# Patient Record
Sex: Female | Born: 1940 | Race: White | Hispanic: No | Marital: Married | Smoking: Never smoker
Health system: Southern US, Academic
[De-identification: ages and names within clinical notes are randomized; demographics above are authoritative.]

## PROBLEM LIST (undated history)

## (undated) DIAGNOSIS — K219 Gastro-esophageal reflux disease without esophagitis: Secondary | ICD-10-CM

## (undated) DIAGNOSIS — Z8611 Personal history of tuberculosis: Secondary | ICD-10-CM

## (undated) DIAGNOSIS — I10 Essential (primary) hypertension: Secondary | ICD-10-CM

## (undated) DIAGNOSIS — E119 Type 2 diabetes mellitus without complications: Secondary | ICD-10-CM

## (undated) DIAGNOSIS — E785 Hyperlipidemia, unspecified: Secondary | ICD-10-CM

## (undated) HISTORY — PX: HAND SURGERY: SHX662

## (undated) HISTORY — DX: Essential (primary) hypertension: I10

## (undated) HISTORY — PX: COLONOSCOPY: WVUENDOPRO10

## (undated) HISTORY — PX: HX LAP CHOLECYSTECTOMY: SHX56

## (undated) HISTORY — PX: HX HYSTERECTOMY: SHX81

---

## 1993-04-05 ENCOUNTER — Other Ambulatory Visit (HOSPITAL_COMMUNITY): Payer: Self-pay

## 2012-08-28 IMAGING — CT ABDOMEN^ABD_WO_SAFECT (ADULT)
1 series · 15 of 32 positions shown, 19 images · non-contrast
Comparison: None.

Royani, Xerxes
CT Abd/Pelvis Apolo Jonatan/[place]

Vantaan Dunder, m.d.
Exam:
CT Abdomen and Pelvis without Contrast, low dose Safe CT protocol
INDICATION: Abdominal pain.
TECHNIQUE: Axial CT imaging of the abdomen and pelvis was performed with oral contrast only.  Additional reformatted sagittal and coronal projections were also obtained.  Total radiation dose length product 486 mGy cm.

[Series 902: abd wo (safect) · axial · 0.89mm/px · z∈[-835,-405]mm · 15 of 97 slices shown, 19 images]
[im 7/97  soft-tissue]
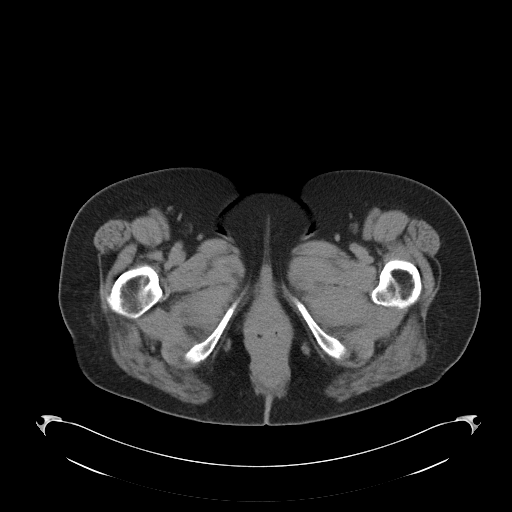
[im 7/97  bone]
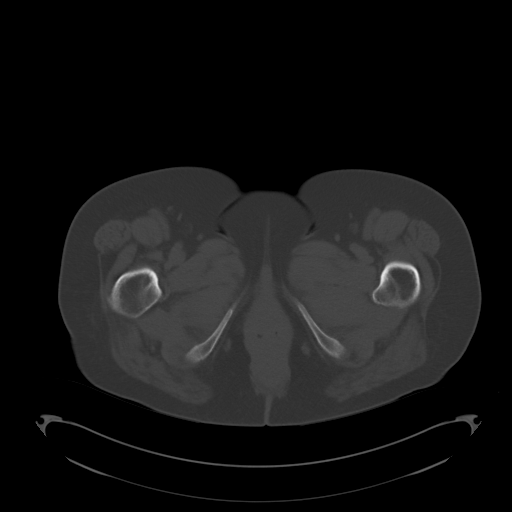
[im 13/97  soft-tissue]
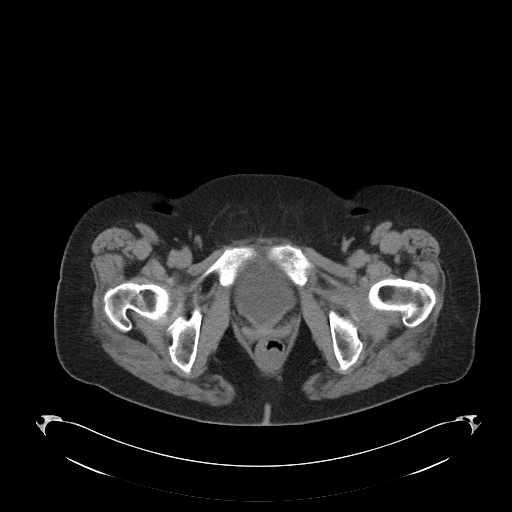
[im 19/97  soft-tissue]
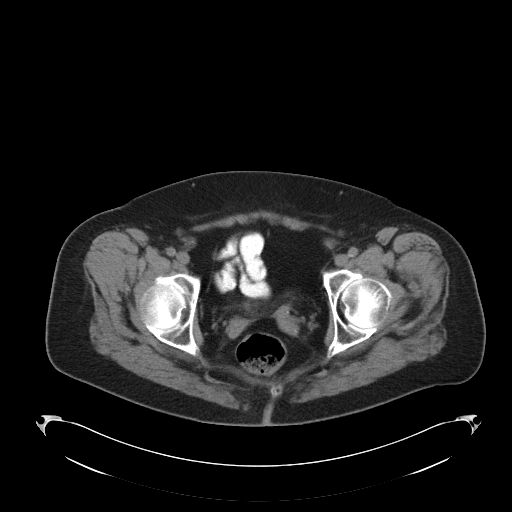
[im 28/97  soft-tissue]
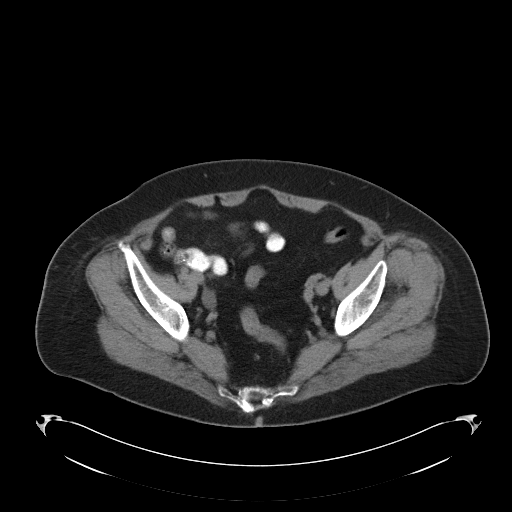
[im 35/97  soft-tissue]
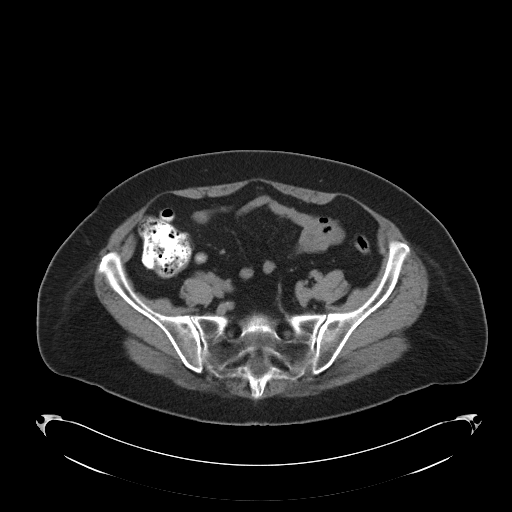
[im 41/97  soft-tissue]
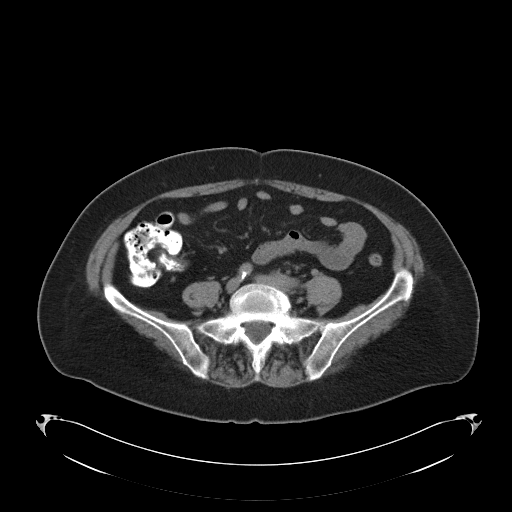
[im 50/97  soft-tissue]
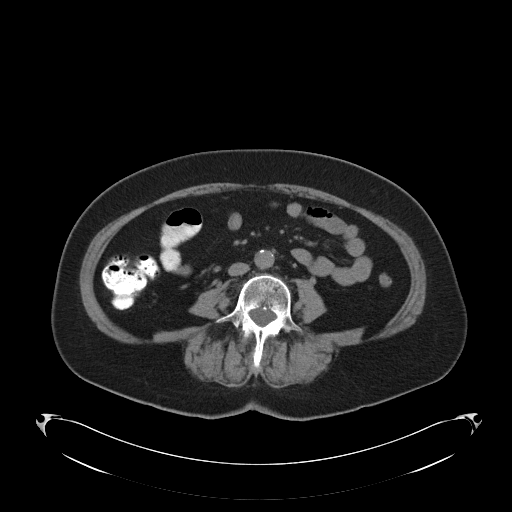
[im 56/97  soft-tissue]
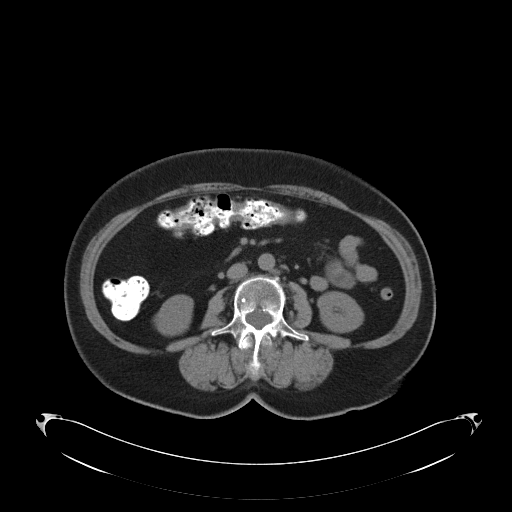
[im 62/97  soft-tissue]
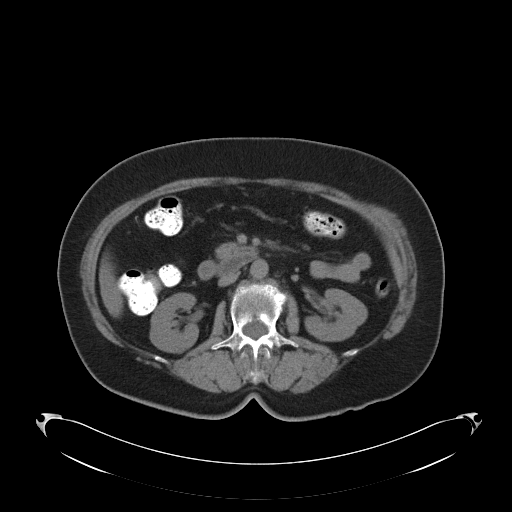
[im 62/97  bone]
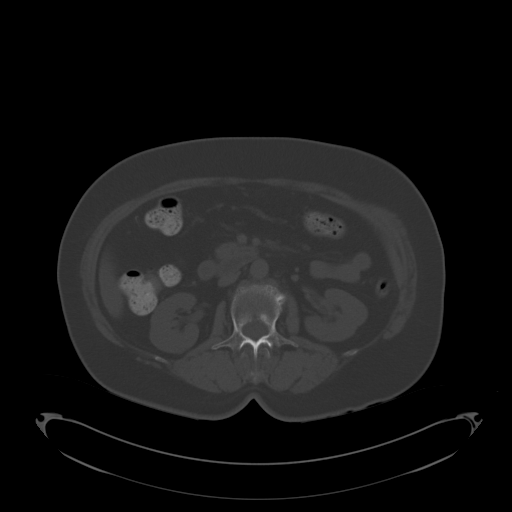
[im 69/97  soft-tissue]
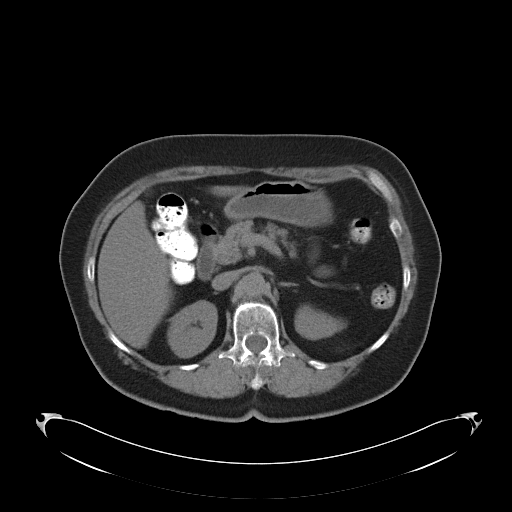
[im 78/97  soft-tissue]
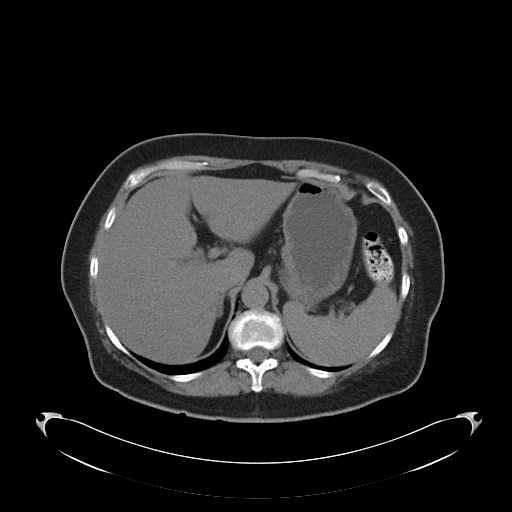
[im 84/97  soft-tissue]
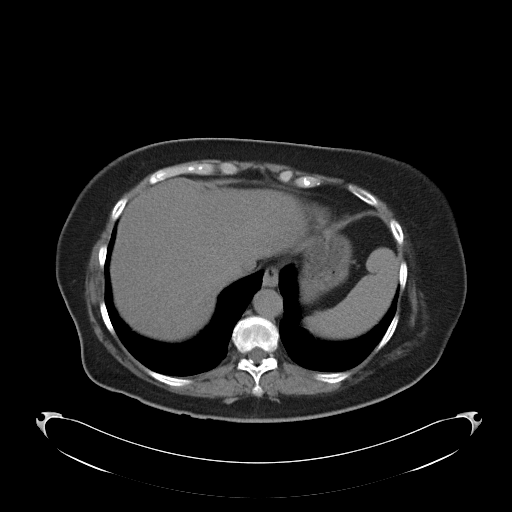
[im 84/97  lung]
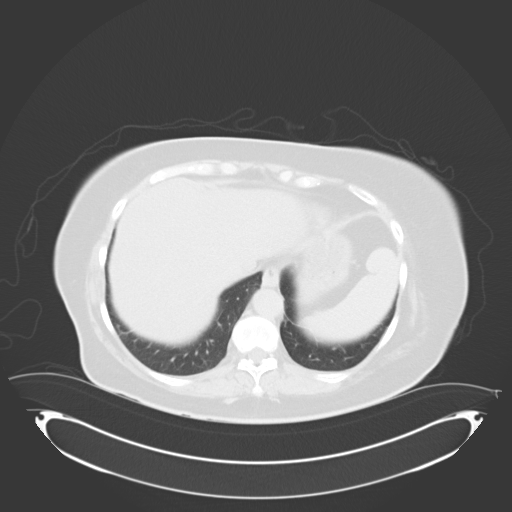
[im 87/97  lung]
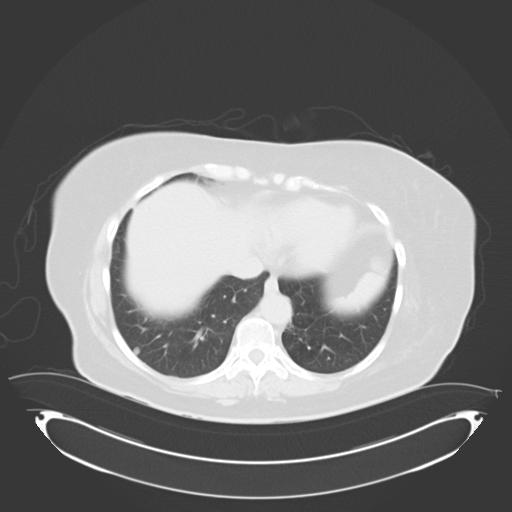
[im 90/97  soft-tissue]
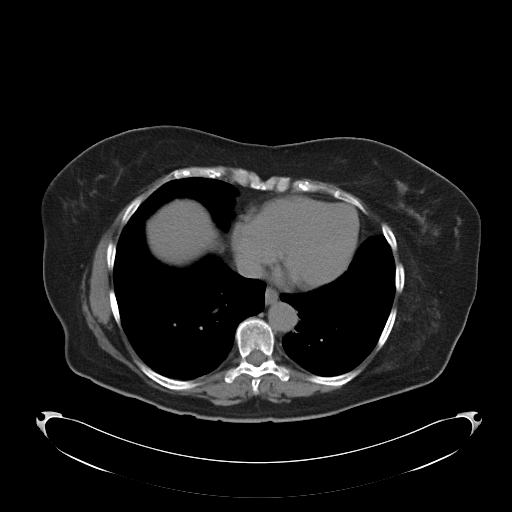
[im 90/97  lung]
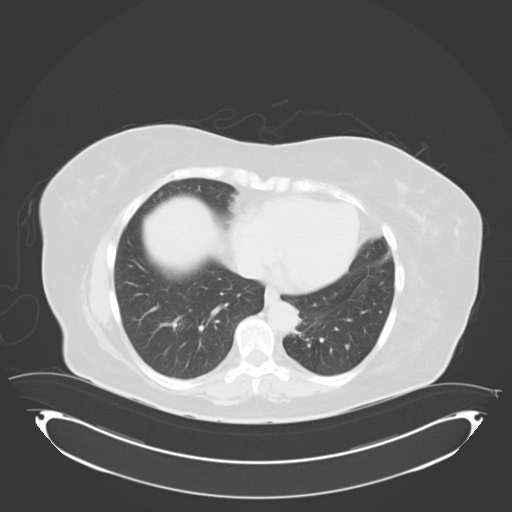
[im 93/97  lung]
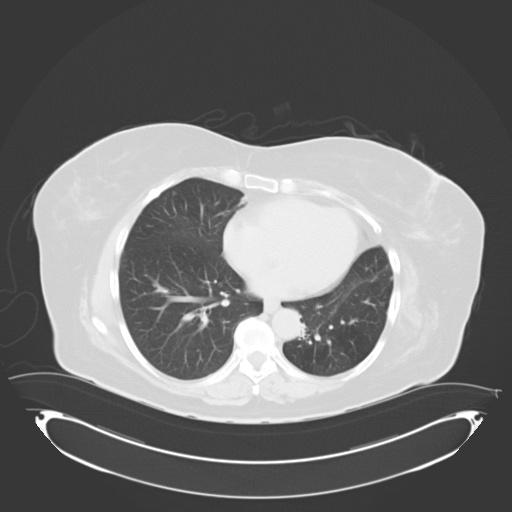

[15 of 32 positions shown; findings below may reference images not displayed]

FINDINGS: ABDOMEN:
There are multiple non-calcified nodules within the lower lobes bilaterally measuring up to 8 mm within the left lower lobe.  There is no pleural or pericardial effusion.  

There is mild fatty infiltration of the liver.  Gallbladder is surgically absent.  There is a punctate calcification within the pancreatic head suggestive of chronic pancreatitis.  Unenhanced spleen, adrenal glands and kidneys are normal.  Bowel loops are normal in course and caliber, there is no obstruction or free air.  There is no adenopathy.  There are scattered vascular calcifications.

PELVIS:
There is no acute inflammatory process.  Uterus and appendix are not visualized.  There is no pelvic free fluid.  There is no adenopathy.  Moderate to severe arthritic changes are noted within the lower lumbar spine.
IMPRESSION: A few non-calcified nodules within the lower lobes bilaterally as detailed above.  A dedicated contrast-enhanced chest CT is recommended for better assessment.
Mild fatty infiltration of the liver.
No acute abnormality within the upper abdomen and pelvis on this limited non-contrast exam.  
Prior cholecystectomy.  Appendix and uterus are also not definitively visualized.
Punctate calcification within the pancreatic head, likely due to chronic pancreatitis.  

________________________________

## 2016-09-13 IMAGING — CR XRAY HIP W/PELVIS UNIL 2-3 VIEWS
1 series · 2 of 2 positions shown · non-contrast
Comparison: None.

Exam:   

AP pelvis and left hip 2V
INDICATION: Pain.

[Series 4: view not recorded · 0.17mm/px · 2 of 2 slices shown]
[im 1/2]
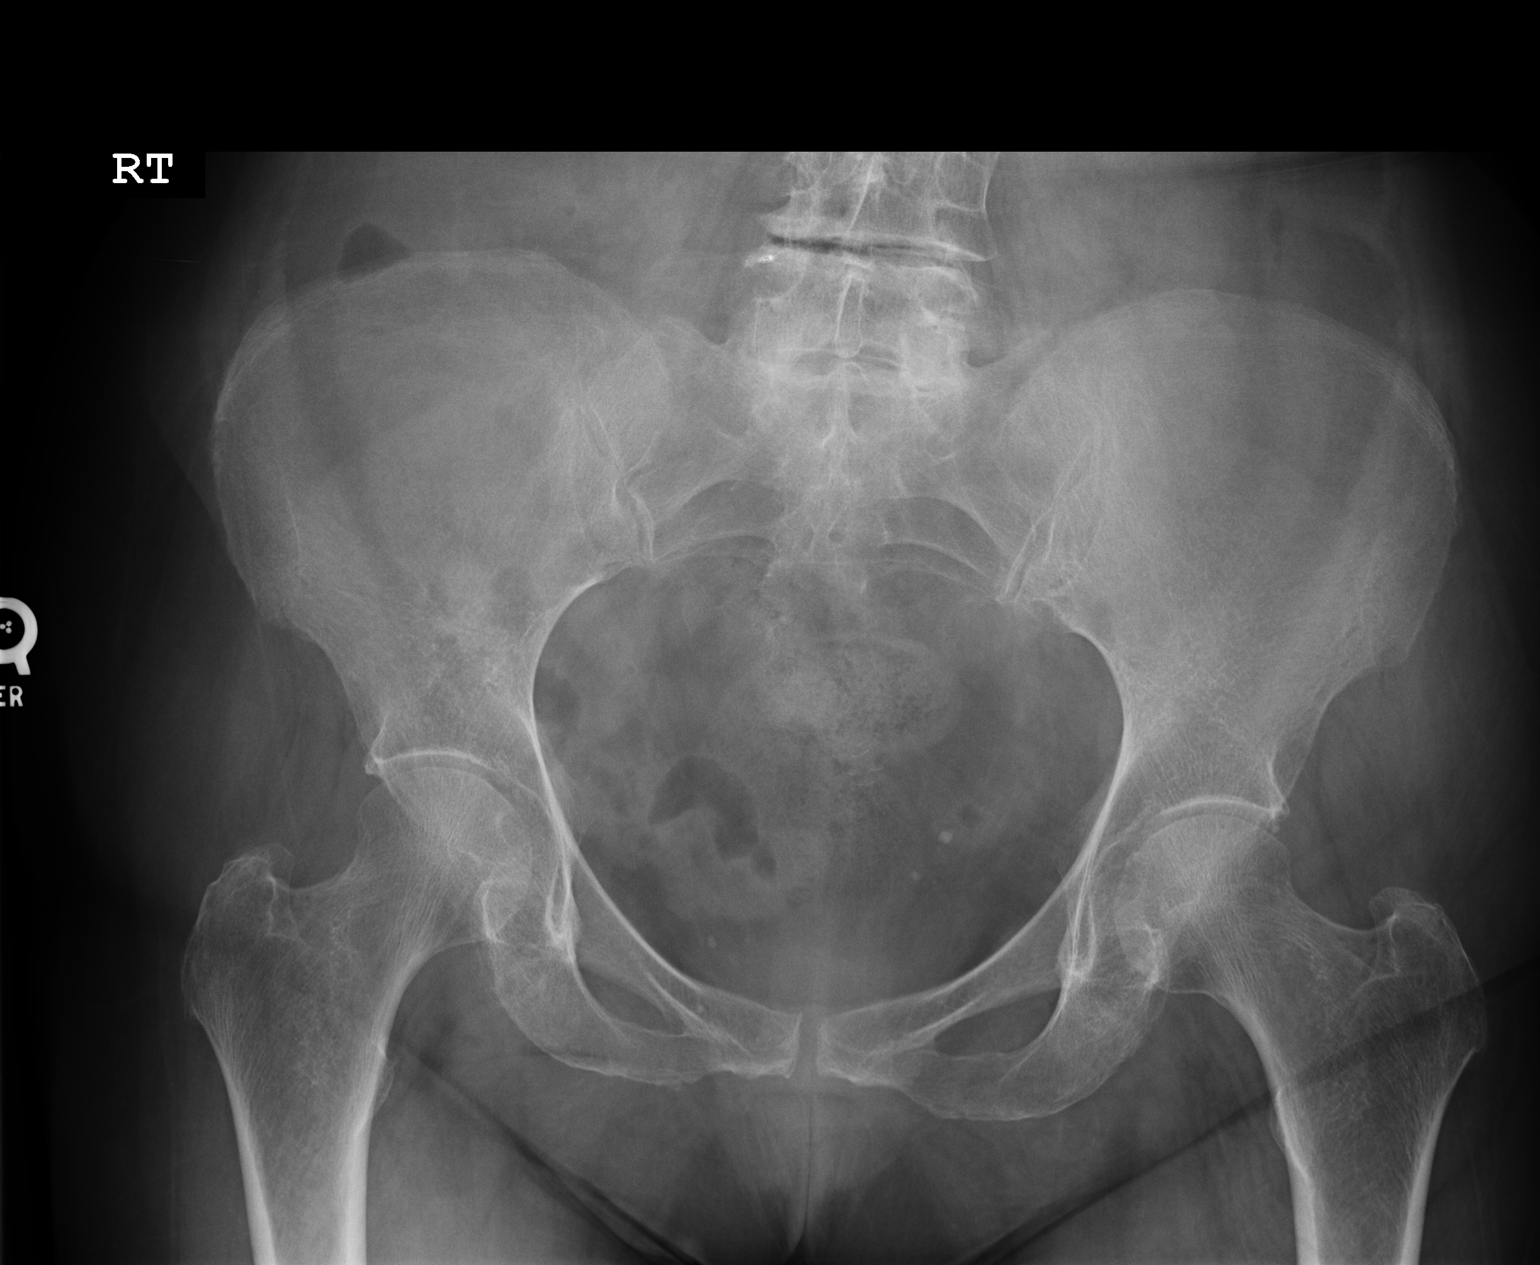
[im 2/2]
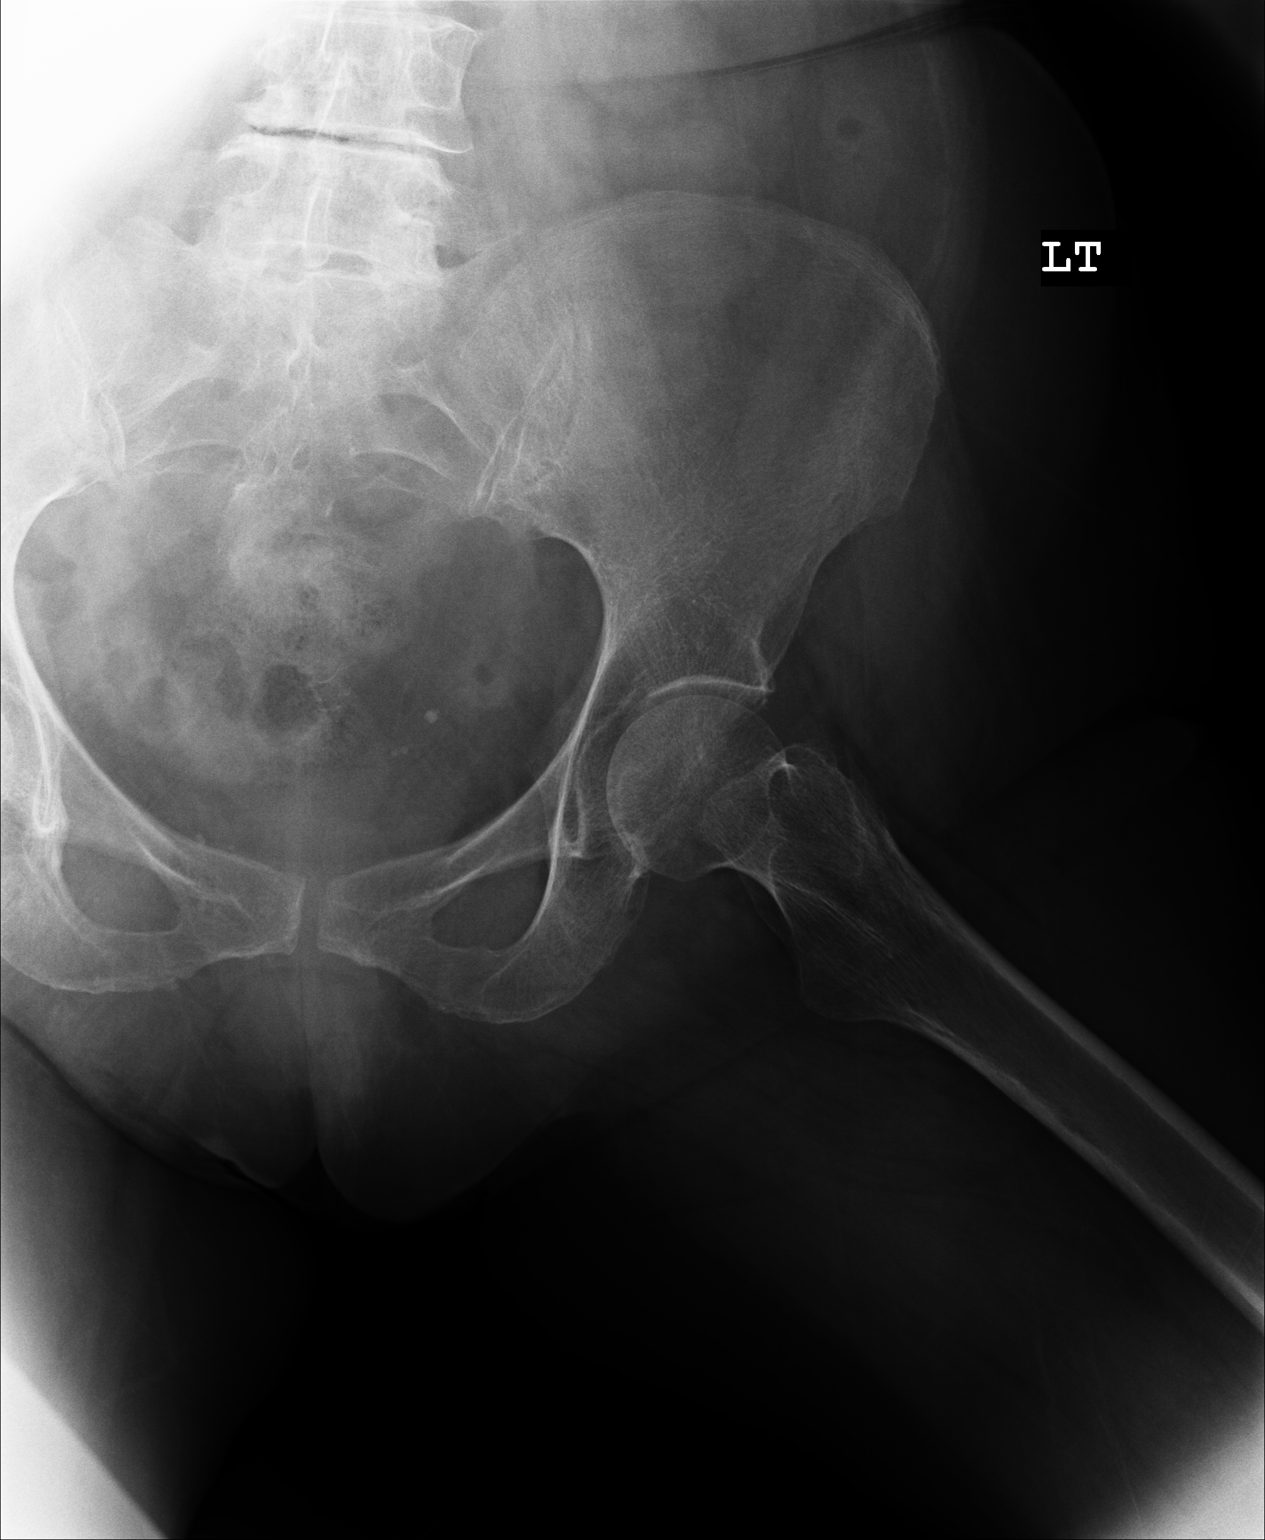

[2 of 2 positions shown; findings below may reference images not displayed]

FINDINGS: The hip is seen in two views. No fracture is seen. The joint space is preserved.
IMPRESSION: Negative left hip.

## 2019-03-18 ENCOUNTER — Ambulatory Visit (INDEPENDENT_AMBULATORY_CARE_PROVIDER_SITE_OTHER): Payer: 59 | Admitting: Rheumatology

## 2019-03-18 ENCOUNTER — Encounter (INDEPENDENT_AMBULATORY_CARE_PROVIDER_SITE_OTHER): Payer: Self-pay | Admitting: Gastroenterology

## 2019-03-18 ENCOUNTER — Other Ambulatory Visit: Payer: Self-pay

## 2019-03-18 ENCOUNTER — Ambulatory Visit: Payer: 59 | Attending: Gastroenterology | Admitting: Gastroenterology

## 2019-03-18 VITALS — BP 139/75 | HR 52 | Temp 97.6°F | Ht 62.0 in | Wt 147.5 lb

## 2019-03-18 DIAGNOSIS — R109 Unspecified abdominal pain: Secondary | ICD-10-CM

## 2019-03-18 LAB — HEPATIC FUNCTION PANEL
ALBUMIN: 4.3 g/dL (ref 3.4–4.8)
ALKALINE PHOSPHATASE: 64 U/L (ref 55–145)
ALT (SGPT): 38 U/L (ref <55)
AST (SGOT): 33 U/L (ref 8–41)
BILIRUBIN DIRECT: 0.3 mg/dL — ABNORMAL HIGH (ref <0.3)
BILIRUBIN TOTAL: 0.7 mg/dL (ref 0.3–1.3)
PROTEIN TOTAL: 7.7 g/dL (ref 6.0–8.0)

## 2019-03-18 LAB — AMYLASE: AMYLASE: 54 U/L (ref 20–160)

## 2019-03-18 LAB — LIPASE: LIPASE: 89 U/L — ABNORMAL HIGH (ref 10–80)

## 2019-03-18 NOTE — Progress Notes (Signed)
Requesting Physician: Hewitt Shorts, MD  History of Present Illness  Karen Chase is a 79 y.o. female who presents with a chief complaint of Abdominal Pain   to clinic. The patient had a small bowel obstruction which she was treated for in October. The patient reports having a history of pancreatitis 10 years ago, when she reports she had turned jaundiced at that time. 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 She may have had additional stent placements around this time as well. She reports having back and RUQ abdominal pain in October as well, and she was told it may be her pancreas. She denies any alcohol. She reports still getting intermittent back and RUQ abdominal pain that is dull. She reports intentional weight loss of 10 lb since October. She denies a family history of pancreas cancer. She also reports getting abdominal bloating often after eating. She denies symptoms.   Past History  Current Outpatient Medications   Medication Sig   . dilTIAZem (CARDIZEM CD) 240 mg Oral Capsule, Sust. Release 24 hr Take 240 mg by mouth Once a day   . ergocalciferol, vitamin D2, (DRISDOL) 1,250 mcg (50,000 unit) Oral Capsule Take 50,000 Units by mouth Every 7 days   . loratadine (CLARITIN) 10 mg Oral Tablet Take 10 mg by mouth Once a day   . LORazepam (ATIVAN) 1 mg Oral Tablet Take 1 mg by mouth Three times a day   . losartan-hydrochlorothiazide (HYZAAR) 100-25 mg Oral Tablet Take 1 Tab by mouth Once a day   . metFORMIN (GLUCOPHAGE) 500 mg Oral Tablet Take 500 mg by mouth Once a day   . metoprolol tartrate (LOPRESSOR) 25 mg Oral Tablet Take 25 mg by mouth Twice daily   . pantoprazole (PROTONIX) 40 mg Oral Tablet, Delayed Release (E.C.) Take 40 mg by mouth Once a day   . pravastatin (PRAVACHOL) 40 mg Oral Tablet Take 40 mg by mouth Once a day   . sertraline (ZOLOFT) 100 mg Oral Tablet Take 100 mg  by mouth Once a day   . traMADoL (ULTRAM) 50 mg Oral Tablet Take 1 Tab by mouth Every 6 hours as needed for Pain     No Known Allergies  Past Medical History:   Diagnosis Date   . Hypertension          Past Surgical History:   Procedure Laterality Date   . HX LAP CHOLECYSTECTOMY             Family History  Family Medical History:     None            Social History  Social History     Socioeconomic History   . Marital status: Married     Spouse name: Not on file   . Number of children: Not on file   . Years of education: Not on file   . Highest education level: Not on file   Tobacco Use   . Smoking status: Never Smoker   . Smokeless tobacco: Never Used     Review of Systems  Constitutional: negative for fevers and chills  Eyes: negative for visual disturbance and irritation  Ears, nose, mouth, throat, and face: negative for sore mouth and sore throat  Respiratory: Negative for shortness of breath and cough   Cardiovascular: negative for chest pain and palpitations  Gastrointestinal: negative for nausea  and vomiting  Genitourinary:negative for frequency and dysuria  Integument/breast: negative for rash and pruritus  Hematologic/lymphatic: negative for easy bruising and bleeding  Musculoskeletal: Negative for back or neck pain.   Neurological: negative for headaches and dizziness  Behavioral/Psych: negative  Endocrine: negative for temperature intolerance and diabetic symptoms  Allergic/Immunologic: negative for urticaria and allergies.     Examination  General: appears in good health  Eyes: Conjunctiva clear.  HENT:Head atraumatic and normocephalic  Neck: No JVD or thyromegaly  Lungs: Clear breath sounds bilaterally.   Cardiovascular: regular rate and rhythm  S1, S2 normal  Abdomen: RUQ slightly tender to palpation. Soft, non-tender, Bowel sounds normal  Extremities: Atraumatic.   Skin: Skin warm and dry   Neurologic: Grossly normal  Psychiatric: Normal    Impression  1. Abdominal pain, unspecified abdominal location       Karen Chase is a 79 y.o. female who presents with a chief complaint of Abdominal Pain to clinic.  Recommendations:  Orders Placed This Encounter   . Hepatic Function Panel   . LIPASE   . Amylase   . COVID-19 SCREENING - PREOP      Abdominal Pain    - Unlikely secondary to pancreas divisum/pancreatits    - Will plan for EGD, Colonoscopy to further evaluate abdominal pain, history of anemia, screening    - Will order liver enzymes and amylase and lipase   - Patient will bring records of her admission in October/imaging on a disc; depending on the results of the above workup, may require further imaging (CT Scan?) in the future.    - Further follow-up pending the above.     Durwin Reges, MD  03/18/2019, 13:52   Fellow - [place]    Pager #: [phone]       Patient seen and examined with the GI fellow. I was present for key portions of the H&P, and I participated in the plan of care. I agree with their findings and recommendations.     [name] J. Theodora Blow, MD FASGE   Professor of Medicine  Director of Advanced Therapeutic Endoscopy  [facility]

## 2019-04-07 ENCOUNTER — Encounter (HOSPITAL_COMMUNITY): Payer: Self-pay

## 2019-04-07 ENCOUNTER — Other Ambulatory Visit: Payer: Self-pay

## 2019-04-07 ENCOUNTER — Inpatient Hospital Stay (HOSPITAL_COMMUNITY)
Admission: RE | Admit: 2019-04-07 | Discharge: 2019-04-07 | Disposition: A | Discharge status: Home / Self Care | Payer: 59 | Source: Ambulatory Visit

## 2019-04-07 HISTORY — DX: Type 2 diabetes mellitus without complications: E11.9

## 2019-04-07 HISTORY — DX: Gastro-esophageal reflux disease without esophagitis: K21.9

## 2019-04-07 HISTORY — DX: Hyperlipidemia, unspecified: E78.5

## 2019-04-07 HISTORY — DX: Personal history of tuberculosis: Z86.11

## 2019-04-14 ENCOUNTER — Inpatient Hospital Stay
Admission: RE | Admit: 2019-04-14 | Discharge: 2019-04-14 | Disposition: A | Discharge status: Home / Self Care | Payer: 59 | Source: Ambulatory Visit | Attending: Gastroenterology | Admitting: Gastroenterology

## 2019-04-14 ENCOUNTER — Ambulatory Visit (HOSPITAL_COMMUNITY): Payer: 59 | Admitting: Certified Registered"

## 2019-04-14 ENCOUNTER — Encounter (HOSPITAL_COMMUNITY)
Admission: RE | Disposition: A | Discharge status: Home / Self Care | Payer: Self-pay | Source: Ambulatory Visit | Attending: Gastroenterology

## 2019-04-14 ENCOUNTER — Ambulatory Visit (HOSPITAL_BASED_OUTPATIENT_CLINIC_OR_DEPARTMENT_OTHER): Payer: 59 | Admitting: Certified Registered"

## 2019-04-14 ENCOUNTER — Encounter (HOSPITAL_COMMUNITY): Payer: Self-pay | Admitting: Gastroenterology

## 2019-04-14 ENCOUNTER — Other Ambulatory Visit: Payer: Self-pay

## 2019-04-14 ENCOUNTER — Ambulatory Visit (HOSPITAL_BASED_OUTPATIENT_CLINIC_OR_DEPARTMENT_OTHER): Payer: 59 | Admitting: Gastroenterology

## 2019-04-14 DIAGNOSIS — K635 Polyp of colon: Secondary | ICD-10-CM | POA: Insufficient documentation

## 2019-04-14 DIAGNOSIS — D649 Anemia, unspecified: Secondary | ICD-10-CM

## 2019-04-14 DIAGNOSIS — K3189 Other diseases of stomach and duodenum: Secondary | ICD-10-CM

## 2019-04-14 DIAGNOSIS — D120 Benign neoplasm of cecum: Secondary | ICD-10-CM

## 2019-04-14 DIAGNOSIS — Z79899 Other long term (current) drug therapy: Secondary | ICD-10-CM | POA: Insufficient documentation

## 2019-04-14 DIAGNOSIS — R1011 Right upper quadrant pain: Secondary | ICD-10-CM

## 2019-04-14 DIAGNOSIS — Z1211 Encounter for screening for malignant neoplasm of colon: Secondary | ICD-10-CM

## 2019-04-14 DIAGNOSIS — Z01818 Encounter for other preprocedural examination: Secondary | ICD-10-CM

## 2019-04-14 LAB — COVID-19 ~~LOC~~ MOLECULAR LAB TESTING
INFLUENZA VIRUS TYPE A: NOT DETECTED
INFLUENZA VIRUS TYPE B: NOT DETECTED
RESPIRATORY SYNCTIAL VIRUS (RSV): NOT DETECTED
SARS-CoV-2: NOT DETECTED

## 2019-04-14 LAB — POC BLOOD GLUCOSE (RESULTS): GLUCOSE, POC: 128 mg/dL — ABNORMAL HIGH (ref 70–105)

## 2019-04-14 SURGERY — GASTROSCOPY
Anesthesia: Monitor Anesthesia Care | Site: Mouth | Wound class: Clean Contaminated Wounds-The respiratory, GI, Genital, or urinary

## 2019-04-14 MED ORDER — LIDOCAINE (PF) 100 MG/5 ML (2 %) INTRAVENOUS SYRINGE
INJECTION | Freq: Once | INTRAVENOUS | Status: DC | PRN
Start: 2019-04-14 — End: 2019-04-14
  Administered 2019-04-14: 50 mg via INTRAVENOUS

## 2019-04-14 MED ORDER — LACTATED RINGERS INTRAVENOUS SOLUTION
INTRAVENOUS | Status: DC
Start: 2019-04-14 — End: 2019-04-14

## 2019-04-14 MED ORDER — PROPOFOL 10 MG/ML INTRAVENOUS EMULSION
INTRAVENOUS | Status: DC | PRN
Start: 2019-04-14 — End: 2019-04-14
  Administered 2019-04-14: 300 ug/kg/min via INTRAVENOUS
  Administered 2019-04-14: 12:00:00 200 ug/kg/min via INTRAVENOUS
  Administered 2019-04-14: 150 ug/kg/min via INTRAVENOUS
  Administered 2019-04-14 (×2): 0 ug/kg/min via INTRAVENOUS

## 2019-04-14 MED ORDER — EPHEDRINE SULFATE 50 MG/ML INJECTION SOLUTION
Freq: Once | INTRAMUSCULAR | Status: DC | PRN
Start: 2019-04-14 — End: 2019-04-14
  Administered 2019-04-14 (×2): 5 mg via INTRAVENOUS

## 2019-04-14 MED ORDER — SODIUM CHLORIDE 0.9 % (FLUSH) INJECTION SYRINGE
2.0000 mL | INJECTION | INTRAMUSCULAR | Status: DC | PRN
Start: 2019-04-14 — End: 2019-04-14

## 2019-04-14 MED ORDER — SODIUM CHLORIDE 0.9 % (FLUSH) INJECTION SYRINGE
2.0000 mL | INJECTION | Freq: Three times a day (TID) | INTRAMUSCULAR | Status: DC
Start: 2019-04-14 — End: 2019-04-14

## 2019-04-14 SURGICAL SUPPLY — 120 items
ATTACHMENT ENDOSCP 4MM 13.4MM RND EDGE STRL LF  DISP (GENE) IMPLANT
ATTACHMENT ENDOSCP 4MM 13.4MM_RND EDG STRL LF DISP (GENE)
ATTACHMENT ENDOSCP 4MM 15MM RN D EDG STRL LF DISP (INSTRUMENTS ENDOMECHANICAL)
ATTACHMENT ENDOSCP 4MM 15MM RND EDGE STRL LF  DISP (ENDOSCOPIC SUPPLIES) IMPLANT
BASIN EME 16OZ 9X3.8X2IN GRAD_FLXB DISP DST ROSE POLYPROP (PATU)
BASIN EME 8.4X3.8X2IN GRAD DISP DST ROSE POLYPROP C500ML LF (PATU) IMPLANT
BITE BLOCK ADULT LATEX FREE 000429 CS/50 (AIR) ×1
BLOCK BITE 20MM PE ADULT MOUTHPC STRAP RETENTION RIM LUM SCPSVR LF  LRG 27MM GRN NONST DISP (AIR) ×2 IMPLANT
BLOCK BITE 27FR INFANT BITEBLOCS PEDIABLOC LF  DISP (AIR) IMPLANT
CANNULA INJ 17GA NDLS SYRG BLUNT STRL LF  10ML BD INTRLNK PLASTIC BXTR INTLNK ABT LS MCGAW SAFELINE (IV TUBING & ACCESSORIES) IMPLANT
CANNULA NASAL 14FT ANGL FLXB L IP PLATE CRSH RS LUM TUBE NFLR (CANNULA)
CANNULA NASAL 14FT ANGL FLXB LIP PLATE CRSH RS LUM TUBE NFLR TP ADULT ARLF UCIT STD CURVE LF  DISP (CANNULA) IMPLANT
CANNULA NASAL 7FT ANGL FLXB LI P PLATE CRSH RS LUM TUBE FLR (CANNULA)
CANNULA NASAL 7FT ANGL FLXB LIP PLATE CRSH RS LUM TUBE FLR TIP ADULT ARLF UCIT STD CURVE LF  DISP (CANNULA) IMPLANT
CATH CRE 10-11-12MM 7.5FR 5.5CM 240CM 2.8MM 3.2MM LOW PROF GW BAL DIL ESOPH PYL BIL PEBAX STRL LF (BALLOON) IMPLANT
CATH ELHMST GLD PRB 10FR 300CM_BIPO RND DIST TIP STD CONN (DIAGNOSTIC)
CATH ELHMST GLD PROBE 10FR 300CM BIPOLAR RND DIST TIP STD CONN FIRM SHAFT HMGLD STRL DISP 3.7MM MN (DIAGNOSTIC) IMPLANT
CATH ELHMST GLD PROBE 7FR 300C M BIPOLAR RND DIST TIP STD (DIAGNOSTIC)
CATH ELHMST GLD PROBE 7FR 300CM BIPOLAR RND DIST TIP STD CONN FIRM SHAFT HMGLD STRL DISP 2.8MM MN (DIAGNOSTIC) IMPLANT
CATH SUCT ARLF TRIFLO 18FR 2 3ANG EYE BVL TIP CONN CONTROL PORT STRL LF  DISP CLR (Suction) IMPLANT
CLIP HMST MR CONDITIONAL BRD CATH ROT CONTROL KNOB NO SHEATH RSL 360 235CM 2.8MM 11MM OPN (SURGICAL INSTRUMENTS) IMPLANT
CONV USE ITEM 343591 - SOLIDIFY FLUID 1500ML DSPNSR L_Q TX SOLIDIFY SFTP LTS+ DISP (STER) ×2 IMPLANT
DEVICE INFNT STFR BRSTL CYTO 2.4MM 230CM STRL LF  DISP (BIOMEDICAL) IMPLANT
DEVICE SPEC RETR TLN 2.5MM 160CM 4 PRONG GRASPER INWRD HOOK SHEATH SS DISP (ENDOSCOPIC SUPPLIES) IMPLANT
DEVICE SPEC RETR TLN 2.5MM 160_CM 4 PRONG GRSP INWRD FACE (INSTRUMENTS ENDOMECHANICAL)
DILATOR ENDOS CRE 180CM 5.5CM 6-7-8MM 7.5FR ESOPH PYL BIL BAL LOW PROF GW PEBAX STRL DISP 2.8MM 3.2 (BALLOON) IMPLANT
DILATOR ENDOS CRE 180CM 5.5CM_6-7-8MM 7.5FR ESOPH PYL BIL (BALLOON)
DILATOR ENDOS CRE 180CM 8CM 10 -11-12MM 6FR ESOPH BAL LOW (BALLOON)
DILATOR ENDOS CRE 180CM 8CM 10-11-12MM 6FR ESOPH BAL LOW PROF FIX WRE PEBAX STRL LF  DISP 2.8MM (BALLOON) IMPLANT
DILATOR ENDOS CRE 180CM 8CM 15 -16.5-18MM 6FR ESOPH BAL LOW (BALLOON)
DILATOR ENDOS CRE 180CM 8CM 15-16.5-18MM 6FR ESOPH BAL LOW PROF FIX WRE PEBAX STRL LF  DISP 2.8MM (BALLOON)
DILATOR ENDOS CRE 180CM 8CM 6-7-8MM 6FR ESOPH BAL LOW PROF FIX WRE PEBAX STRL LF  DISP 2.8MM (BALLOON) IMPLANT
DILATOR ENDOS CRE 180CM 8CM 6-_7-8MM 6FR ESOPH BAL LOW PROF (BALLOON)
DILATOR ENDOS CRE 180CM 8CM 6FR 12-13.5-15MM ESOPH FIX WRE BAL RND SHLDR PEBAX STRL LF  DISP (GI LAB SUPPLIES) IMPLANT
DILATOR ENDOS CRE 180CM 8CM 8-9-10MM 6FR ESOPH BAL LOW PROF FIX WRE PEBAX STRL LF  DISP 2.8MM (GI LAB SUPPLIES) IMPLANT
DILATOR ENDOS CRE 180CM 8CM 8-_9-10MM 6FR ESOPH BAL LOW PROF (GI LAB SUPPLIES)
DILATOR ENDOS CRE 240CM 5.5CM 11-13.5-15MM 7.5FR ESOPH PYL (BALLOON)
DILATOR ENDOS CRE 240CM 5.5CM 11-13.5-15MM 7.5FR ESOPH PYL BIL BAL LOW PROF GW PEBAX STRL LF  DISP (BALLOON) IMPLANT
DILATOR ENDOS CRE 240CM 5.5CM 15-16.5-18MM 7.5FR ESOPH PYL BIL BAL LOW PROF GW PEBAX STRL LF  DISP (BALLOON) IMPLANT
DILATOR ENDOS CRE 240CM 5.5CM_15-16.5-18MM 7.5FR ESOPH PYL (BALLOON)
DILATOR HERCULES 10-11-12_M00558680 EA (BALLOON)
DISC USE ITEM 82101 - TUBING OXYGEN 50/CS 001302 (TUBE/TUBING & SUCTION SUPPLIES)
DISCONTINUED USE ITEM 309153 - TRAP SPECI ARGYLE 40CC GRAD SC_REW ON CAP REM MALE CONN (Cautery Accessories) IMPLANT
DISCONTINUED USE ITEM 339015 - CONTAINR STRL 10% NEUT BF FRMLN POLYPROP GRAD LEAK RST ORNG PREFL SCREW CAP FSHR HLTHCR PRTCL GRN (CHEM) ×10 IMPLANT
DISCONTINUED USE ITEM 82101 - TUBING OXYGEN 50/CS 001302 (TUBE/TUBING & SUCTION SUPPLIES) IMPLANT
DUPE USE ITEM 301092 - DILATOR ENDOS CRE 180CM 8CM 15-16.5-18MM 6FR ESOPH BAL LOW PROF FIX WRE PEBAX STRL LF  DISP 2.8MM (BALLOON) IMPLANT
ELECTRODE ESURG 165CM ITKNIFE NANO STRL CERM 3.5MM DISP INSL TIP LF  ACPT 2.8MM CHNL ENDOS (ENDOSCOPIC SUPPLIES) IMPLANT
ELECTRODE ESURG 165CM TRIANGLETIP KNIFE STRL CERM 4.5MM DISP LF  ACPT 2.8MM CHNL ENDOS SUBMUCOSAL (ENDOSCOPIC SUPPLIES) IMPLANT
ELECTRODE ESURG 230CM DUALKNIFE J STRL CERM 1.5MM DISP BUIL IN HNDL FLUID INJ LF  ACPT 2.8MM CHNL (Cautery Accessories) IMPLANT
ELECTRODE ESURG 230CM ITKNIFE NANO STRL CERM 3.5MM DISP INSL TIP LF  ACPT 2.8MM CHNL ENDOS (CAUTERY SUPPLIES) IMPLANT
ELECTRODE PATIENT RTN 9FT VLAB C30- LB RM PHSV ACRL FOAM CORD NONIRRITATE NONSENSITIZE ADH STRP (CAUTERY SUPPLIES) IMPLANT
ELECTRODE PATIENT RTN 9FT VLAB_REM C30- LB PLHSV ACRL FOAM (CAUTERY SUPPLIES)
FORCEPS BIOPSY 240CM 2.4MM RJ_4 2.8MM LRG CPC DISP ORNG (GUIDING)
FORCEPS BIOPSY MICROMESH TTH STREAMLINE CATH 240CM 2.4MM RJ 4 SS LRG CPC STRL DISP ORNG 2.8MM WRK (GUIDING) IMPLANT
FORCEPS BIOPSY NEEDLE 160CM 1.8MM RJ 4 DISP YW 2MM WRK CHNL GSPED (SURGICAL INSTRUMENTS) IMPLANT
FORCEPS BIOPSY NEEDLE 160CM 1._8MM RJ 4 PED 2+ MM DISP (INSTRUMENTS)
FORCEPS BIOPSY NEEDLE 240CM 2.2MM RJ 4 2.8MM STD CPC STRL DISP ORNG (SURGICAL INSTRUMENTS) IMPLANT
FORCEPS BIOPSY NEEDLE 240CM 2._2MM RJ 4 2.8MM STD CPC STRL (INSTRUMENTS)
FORCEPS ENDOS 240CM 2.8MM RJ 4 JMB DISP (SURGICAL INSTRUMENTS) ×2 IMPLANT
FORCEPS ENDOS 240CM 2.8MM RJ 4_JMB DISP (INSTRUMENTS) ×1
FORCEPS ENDOS HMST GRSPR MONOP_OL 165CM 2.75MM COAGRASPER 2.8 (INSTRUMENTS ENDOMECHANICAL)
FORCEPS ESURG 165X5MM COAGRASPER HMST STRL LF  DISP (ENDOSCOPIC SUPPLIES) IMPLANT
FORCEPS ESURG 230X4MM COAGRASPER HMST STRL LF  DISP (ENDOSCOPIC SUPPLIES) IMPLANT
FORCEPS MONOPOL 230X4MM 2.75MM 3.2MM COAGRASPER HMSTS STRL (INSTRUMENTS ENDOMECHANICAL)
FORCEPS SPEC RETR 240CM 2.3MM 15MM TFLN SS 3 RING HNDL 3 PRONG MONOF GRSP FB STRL LF  DISP (ENDOSCOPIC SUPPLIES) IMPLANT
GOWN SURG XL L3 NONREINFORCE HKLP CLSR STRL LTX PNK SMS 47IN (DGOW) IMPLANT
JELLY LUB PDI BCTRST H2O SOL N ONSTAIN NGRS STRL GLYC MTHY (SURGICAL INSTRUMENTS) IMPLANT
KIT ENDOS ENDOZIME BDSD SLR SPONGE ENZM DETERGENT 500ML (DIS) IMPLANT
KNIFE ENDOS 165CM 1.7MM .9MM I_TKNIFE NANO CERM SM INSL TIP (INSTRUMENTS ENDOMECHANICAL)
KNIFE ESURG 2300MM 2.8MM DUALK_NIFEJ BUIL IN HNDL FLUID INJ (Cautery Accessories)
LIGATOR 122MM 9.5-13MM 6SHTR SA 6 BAND TRGR CORD NONST DISP ENDOS ESOPH VARICES NATURAL RUB LTX (SUTURE STAPLING DEVICES) IMPLANT
LIGATOR 122MM 9.5-13MM 6SHTR S_D 6 BAND STRL DISP ENDOS 2.8MM (SUTURE STAPLING DEVICES)
LIGATOR 2300MM 30MM PLLP PRELD_DEV INT HNDL DISP ENDOS (INSTRUMENTS ENDOMECHANICAL)
LIGATOR DEV STRL DISP ENDOS LF (ENDOSCOPIC SUPPLIES) IMPLANT
LINER SUCT RD CRD MEDIVAC TW LOCK LID SHTOF VALVE CAN FILTER 1500CC LF  DISP (Suction) ×2 IMPLANT
LOOP AUTOCLAV HLDR DTCH 30MM S_URG NYL PLPCTM NONST DISP (INSTRUMENTS ENDOMECHANICAL)
LOOP HLDR DTCH AUTOCLAV 30MM SURG NYL PLPCTM NONST LF  DISP (ENDOSCOPIC SUPPLIES) IMPLANT
MARKER ENDOS SPOT EX PERM IND DRK SYRG 5ML (GENE) IMPLANT
NEEDLE ENDOS 5MM 25GA 2.5MM SS TFLN 230CM INJ PRJ SHEATH LL SPRG LD HNDL CRLK STRL LF  DISP (NEEDLES & SYRINGE SUPPLIES) IMPLANT
NEEDLE ENDOS 5MM 25GA 2.5MM SS TFLN 230CM INJ PRJ SHTH LL (NEEDLES & SYRINGE SUPPLIES)
NEEDLE SCLRTX 25GA 2.3MM OPTC TIP SHTH STRL LF DISP YW (NEEDLES & SYRINGE SUPPLIES) IMPLANT
NET SPEC RETR 160CM 3MM RTHNT MAXI SHEATH 8X4CM NONST LF  DISP (Dilators) IMPLANT
OVERTUBE ENDOS 25CM 19.5MM 8.6-10MM 16.7MM INSFL CAP GUARDUS STD TAPER ESPH NONST LF  DISP (AIR) IMPLANT
OVERTUBE ENDOS 25CM 19.5MM 8.6_-10MM 16.7MM INSFL CAP GUARDUS (AIR)
OVERTUBE ENDOS 50CM 19.5MM 8.6-10MM 16.7MM TAPER TIP INSFL CAP GUARDUS GASTRIC LF (ENDOSCOPIC SUPPLIES) IMPLANT
OVERTUBE ENDOS 50CM 19.5MM 8.6_-10MM 16.7MM INSFL CAP PROTECT (INSTRUMENTS ENDOMECHANICAL)
PROBE COAG 7.2FT 6.9FR FIAPC CRCMF PLUG PLAY FUNCTIONALITY FILTER (ENDOSCOPIC SUPPLIES) IMPLANT
PROBE ESURG 220CM 2.3MM FIAPC FLXB STR FIRE ARGON PLAS COAG (CAUTERY SUPPLIES)
PROBE ESURG 220CM 2.3MM FIAPC FLXB STR FIRE STRL DISP (CAUTERY SUPPLIES) IMPLANT
PROBE ESURG 7.2FT 6.9FR FIAPC_CRCMF PLUG PLAY FUNCTIONALITY (INSTRUMENTS ENDOMECHANICAL)
RETRIEVER 230CM 2.5MM ALT WV N_T ENDOS NONST RTHNT 6X3CM (INSTRUMENTS ENDOMECHANICAL)
RETRIEVER ENDOS 160CM 1.8MM RTHNT MINI SM CATH SHEATH 4.5X2CM NONST (Dilators) IMPLANT
RETRIEVER ENDOS 230CM 2.5MM RTHNT ALT WV 6X3CM (ENDOSCOPIC SUPPLIES) IMPLANT
SET EXT STR 6.2ML 42IN IV MALE LL ADPR STD BORE 2 LL ACT VALVE RETRACT COL STRL CLRLNK LF  DEHP (IV TUBING & ACCESSORIES) IMPLANT
SNARE 2.8CM RND 240CM 15MM 2.4MM CAPTIVATR II LOOP STF SHEATH BRD WRE ENDOS PLPCTM STRL DISP (ENDOSCOPIC SUPPLIES) ×2 IMPLANT
SNARE 2.8CM RND 240CM 15MM 2.4_MM CAPTIVATR II LOOP STF SHTH (INSTRUMENTS ENDOMECHANICAL) ×1
SNARE 9MM 230CM 2.4MM EXACTO COLD BRD WRE CLEAN CUT ENDOS PLC RESCT STRL LF  DISP (ENDOSCOPIC SUPPLIES) ×2 IMPLANT
SNARE MED OVAL 240CM 2.4MM SNS LOOP SHRTHRW FLXB ENDOS 2.8MM WRK CHNL PLPCTM 27MM STRL DISP (ENDOSCOPIC SUPPLIES) IMPLANT
SNARE MED OVAL 240CM 2.4MM SNS_LOOP SHRTHRW FLXB ENDOS 2.8MM (INSTRUMENTS ENDOMECHANICAL)
SNARE SM OVAL 240CM 2.4MM SNS LOOP SHRTHRW FLXB ENDOS PLPCTM 13MM STRL LF  DISP (DIAGNOSTIC) IMPLANT
SNARE SM OVL 240CM 2.4MM SNS L_OOP SHRTHRW FLXB ENDOS PLPCTM (DIAGNOSTIC)
SOLIDIFY FLUID 1500ML DSPNSR L Q TX SOLIDIFY SFTP LTS+ DISP (STER) ×1
SOLIDIFY FLUID 1500ML DSPNSR L_Q TX SOLIDIFY SFTP LTS+ DISP (STER) ×2
SOLUTION IRRG H2O 500CC 2F7113 (SOLUTIONS) ×1
SYRINGE INFLAT ALN II GA STRL DISP 60ML (NEEDLES & SYRINGE SUPPLIES) IMPLANT
SYRINGE LL 50ML LF  STRL GRAD N-PYRG DEHP-FR PVC FREE MED DISP CLR (NEEDLES & SYRINGE SUPPLIES) IMPLANT
SYRINGE LL 50ML LF STRL GRAD N-PYRG DEHP-FR PVC FREE MED (NEEDLES & SYRINGE SUPPLIES)
TRAP SPEC RETR 15CM ETRAP PLPC_TM STRL DISP (INSTRUMENTS) ×1
TRAP SPEC RETR ETRAP MAGNIFY W IND MSR GUIDE PLYP LF DISP (GI LAB SUPPLIES) IMPLANT
TRAP SPECI ARGYLE 40CC GRAD SC_REW ON CAP REM MALE CONN (Cautery Accessories)
TRAP SPECI REM 15CM ETRAP MAGNIFY WIND MSR GUIDE PLPCTM STRL LF  DISP (SURGICAL INSTRUMENTS) ×2 IMPLANT
TRAY GASTRIC LAV 36IN 24FR ARGYLE EDLICH MONOJECT MED PVC 4 EYE CLS END GRAD SYRG TRNSPR 140CC PED (TRAY) IMPLANT
TRAY GASTRIC LAV 36IN 24FR ARG_YLE EDLICH MONOJECT MED PVC 4 (TRAY)
TRAY GASTRIC LAV 36IN 34FR ARGYLE EDLICH MONOJECT LRG PVC 4 EYE CLS END GRAD SYRG TRNSPR 140CC NONST (TRAY) IMPLANT
TRAY GASTRIC LAV 36IN 34FR ARG_YLE EDLICH MONOJECT LRG PVC 4 (TRAY)
TUBING OXYGEN 50/CS 001302 (TUBE/TUBING & SUCTION SUPPLIES)
TUBING SUCT CLR 20FT 9/32IN MEDIVAC NCDTV M/M CONN STRL LF (Suction) IMPLANT
TUBING SUCT CONN 20FT LONG_STRL N720A (Suction)
USE ITEM 43607 SNARE MED OVAL 240CM 2.4MM SNS LOOP SHRTHRW FLXB ENDOS 2.8MM WRK CHNL PLPCTM 27MM STRL DISP (ENDOSCOPIC SUPPLIES) IMPLANT
WATER STRL 500ML PLASTIC PR BTL LF (SOLUTIONS) ×2 IMPLANT

## 2019-04-14 NOTE — Anesthesia Preprocedure Evaluation (Signed)
ANESTHESIA PRE-OP EVALUATION  Planned Procedure: GASTROSCOPY (N/A Mouth)  COLONOSCOPY (N/A Anus)  Review of Systems         patient summary reviewed  nursing notes reviewed        Pulmonary     Cardiovascular    ECG reviewed ,       GI/Hepatic/Renal        Endo/Other          Neuro/Psych/MS        Cancer                     Physical Assessment      Patient summary reviewed and Nursing notes reviewed   Airway       Mallampati: II      Mouth Opening: good.            Dental                    Pulmonary      (-) no stridor     Cardiovascular      Rate: Normal       Other findings            Plan  ASA 3     Planned anesthesia type: MAC              Intravenous induction     Anesthesia issues/risks discussed are: Dental Injuries, PONV and Sore Throat.  Anesthetic plan and risks discussed with patient.      Use of blood products discussed with patient who.     Patient's NPO status is appropriate for Anesthesia.           (Hard of hearing)

## 2019-04-14 NOTE — Nurses Notes (Signed)
Pt transported to area d for post op conference with dr Theodora Blow , Lorre Nick [phone] contacted and is aware.

## 2019-04-14 NOTE — Anesthesia Postprocedure Evaluation (Signed)
Anesthesia Post Op Evaluation    Patient: Karen Chase  Procedure(s) with comments:  GASTROSCOPY - covid RAPID (extra 2hr arrival) - pg Supervisor upon pt arrival  COLONOSCOPY  GASTROSCOPY WITH BIOPSY    Last Vitals:Temperature: 36.2 C (97.2 F) (04/14/19 1246)  Heart Rate: 56 (04/14/19 1246)  BP (Non-Invasive): (!) 107/52 (04/14/19 1246)  Respiratory Rate: 14 (04/14/19 1246)  SpO2: 97 % (04/14/19 [id number])    No complications documented.      Patient location during evaluation: PACU           Anesthetic complications: no  Cardiovascular status: acceptable  Respiratory status: acceptable  Hydration status: acceptable

## 2019-04-14 NOTE — Anesthesia Transfer of Care (Signed)
ANESTHESIA TRANSFER OF CARE   Karen Chase is a 79 y.o. ,female, Weight: 65.7 kg (144 lb 13.5 oz)   had Procedure(s) with comments:  GASTROSCOPY - covid RAPID (extra 2hr arrival) - pg Supervisor upon pt arrival  COLONOSCOPY  GASTROSCOPY WITH BIOPSY  performed  04/14/19   Primary Service: Shannan Harper, MD    Past Medical History:   Diagnosis Date   . Esophageal reflux     controlled with med   . History of tuberculosis     as teenager   . Hyperlipidemia    . Hypertension    . Type 2 diabetes mellitus (CMS HCC)       Allergy History as of 04/14/19      No Known Allergies              I completed my transfer of care / handoff to the receiving personnel during which we discussed:  Access, Airway, All key/critical aspects of case discussed, Analgesia, Antibiotics, Expectation of post procedure, Fluids/Product, Gave opportunity for questions and acknowledgement of understanding, Labs and PMHx    Post Location: Phase II                                                                Last OR Temp: Temperature: 36.2 C (97.2 F)  ABG:   Airway:* No LDAs found *  Blood pressure (!) 107/52, pulse 56, temperature 36.2 C (97.2 F), resp. rate 14, height 1.588 m (5' 2.5"), weight 65.7 kg (144 lb 13.5 oz), SpO2 97 %.

## 2019-04-14 NOTE — H&P (Signed)
[facility]                                                      Karen Chase, Karen Chase, 79 y.o. female  Date of Admission:  04/14/2019  Date of Birth:  1940/12/10    Date:  04/14/2019    STOP: IF H&P IS GREATER THAN 30 DAYS FROM SURGICAL DAY COMPLETE NEW H&P IS REQUIRED.    Outpatient Pre-Surgical H & P updated the day of the procedure.  1.  H&P the patient has been examined, and no change has occured in the patients condition since the H&P was completed.       Change in medications: No      Last Menstrual Period: N/A    2.  Patient continues to be appropiate candidate for planned surgical procedure. YES      H&P done on 03/17/18 by Dr. Theodora Blow.       Karen Hurt, MD  04/14/2019, 11:58  Gastroenterology Fellow PGY-6

## 2019-04-14 NOTE — Nurses Notes (Signed)
DC orders have yet to be placed by dr Theodora Blow , his service has been contacted twice.

## 2019-04-14 NOTE — Discharge Instructions (Signed)
SURGICAL DISCHARGE INSTRUCTIONS     Dr. Shannan Harper, MD  performed your GASTROSCOPY, COLONOSCOPY, GASTROSCOPY WITH BIOPSY today at the [facility]:  Monday through Friday from 6 a.m. - 7 p.m.: [phone]  Between 7 p.m. - 6 a.m., weekends and holidays:  Call [facility] at [phone] or [phone].    PLEASE SEE WRITTEN HANDOUTS AS DISCUSSED BY YOUR NURSE:      SIGNS AND SYMPTOMS OF A WOUND / INCISION INFECTION   Be sure to watch for the following:   Increase in redness or red streaks near or around the wound or incision.   Increase in pain that is intense or severe and cannot be relieved by the pain medication that your doctor has given you.   Increase in swelling that cannot be relieved by elevation of a body part, or by applying ice, if permitted.   Increase in drainage, or if yellow / green in color and smells bad. This could be on a dressing or a cast.   Increase in fever for longer than 24 hours, or an increase that is higher than 101 degrees Fahrenheit (normal body temperature is 98 degrees Fahrenheit). The incision may feel warm to the touch.    **CALL YOUR DOCTOR IF ONE OR MORE OF THESE SIGNS / SYMPTOMS SHOULD OCCUR.    ANESTHESIA INFORMATION   LOCAL ANESTHETIC:  You have receieved a local anesthetic, the effects should disappear in a few hours.    REMEMBER   If you experience any difficulty breathing, chest pain, bleeding that you feel is excessive, persistent nausea or vomiting or for any other concerns:  Call your physician Dr. Theodora Blow at [phone] or [phone]. You may also ask to have the GI doctor on call paged. They are available to you 24 hours a day.    SPECIAL INSTRUCTIONS / COMMENTS       FOLLOW-UP APPOINTMENTS   Please call patient services at [phone] or [phone] to schedule a date / time of return. They are open Monday - Friday from 7:30 am - 5:00 pm.

## 2019-04-17 LAB — SURGICAL PATHOLOGY SPECIMEN

## 2019-04-23 ENCOUNTER — Ambulatory Visit (INDEPENDENT_AMBULATORY_CARE_PROVIDER_SITE_OTHER): Payer: Self-pay | Admitting: Gastroenterology

## 2019-04-23 NOTE — Telephone Encounter (Addendum)
Returned call to office. Faxed procedure reports and pathology report per request to [phone].Roswell Miners, RN  04/23/2019, 14:02  ----- Message from Ralene Cork, [place] COORDINATOR sent at 04/23/2019  1:39 PM EST -----  ----- Message from Frederico Hamman sent at 04/23/2019  1:25 PM EST -----  Shannan Harper, MD    Dr. Doralee Albino office calling to request notes from pt's procedure on 04/14/19. Please call to advise. Thank you!

## 2019-05-13 ENCOUNTER — Encounter (HOSPITAL_COMMUNITY): Payer: Self-pay | Admitting: NURSE PRACTITIONER

## 2021-05-06 ENCOUNTER — Other Ambulatory Visit: Payer: Self-pay

## 2021-05-06 ENCOUNTER — Emergency Department
Admission: EM | Admit: 2021-05-06 | Discharge: 2021-05-06 | Disposition: A | Discharge status: Home / Self Care | Payer: 59 | Attending: Emergency Medicine | Admitting: Emergency Medicine

## 2021-05-06 ENCOUNTER — Encounter (HOSPITAL_COMMUNITY): Payer: Self-pay

## 2021-05-06 DIAGNOSIS — Z794 Long term (current) use of insulin: Secondary | ICD-10-CM | POA: Insufficient documentation

## 2021-05-06 DIAGNOSIS — M542 Cervicalgia: Secondary | ICD-10-CM

## 2021-05-06 DIAGNOSIS — R531 Weakness: Secondary | ICD-10-CM

## 2021-05-06 DIAGNOSIS — S161XXA Strain of muscle, fascia and tendon at neck level, initial encounter: Secondary | ICD-10-CM | POA: Insufficient documentation

## 2021-05-06 DIAGNOSIS — E119 Type 2 diabetes mellitus without complications: Secondary | ICD-10-CM | POA: Insufficient documentation

## 2021-05-06 DIAGNOSIS — X58XXXA Exposure to other specified factors, initial encounter: Secondary | ICD-10-CM | POA: Insufficient documentation

## 2021-05-06 DIAGNOSIS — R5383 Other fatigue: Secondary | ICD-10-CM | POA: Insufficient documentation

## 2021-05-06 LAB — COMPREHENSIVE METABOLIC PANEL, NON-FASTING
ALBUMIN/GLOBULIN RATIO: 1.2 (ref 0.8–1.4)
ALBUMIN: 4.1 g/dL (ref 3.5–5.7)
ALKALINE PHOSPHATASE: 56 U/L (ref 34–104)
ALT (SGPT): 24 U/L (ref 7–52)
ANION GAP: 8 mmol/L — ABNORMAL LOW (ref 10–20)
AST (SGOT): 25 U/L (ref 13–39)
BILIRUBIN TOTAL: 0.6 mg/dL (ref 0.3–1.2)
BUN/CREA RATIO: 16 (ref 6–22)
BUN: 19 mg/dL (ref 7–25)
CALCIUM, CORRECTED: 9.6 mg/dL (ref 8.9–10.8)
CALCIUM: 9.7 mg/dL (ref 8.6–10.3)
CHLORIDE: 99 mmol/L (ref 98–107)
CO2 TOTAL: 27 mmol/L (ref 21–31)
CREATININE: 1.21 mg/dL (ref 0.60–1.30)
ESTIMATED GFR: 45 mL/min/{1.73_m2} — ABNORMAL LOW (ref >59)
GLOBULIN: 3.4 (ref 2.9–5.4)
GLUCOSE: 112 mg/dL — ABNORMAL HIGH (ref 74–109)
OSMOLALITY, CALCULATED: 271 mOsm/kg (ref 270–290)
POTASSIUM: 3.8 mmol/L (ref 3.5–5.1)
PROTEIN TOTAL: 7.5 g/dL (ref 6.4–8.9)
SODIUM: 134 mmol/L — ABNORMAL LOW (ref 136–145)

## 2021-05-06 LAB — URINALYSIS, MACRO/MICRO
BILIRUBIN: NEGATIVE mg/dL
BLOOD: NEGATIVE mg/dL
GLUCOSE: NEGATIVE mg/dL
KETONES: NEGATIVE mg/dL
LEUKOCYTES: NEGATIVE WBCs/uL
NITRITE: NEGATIVE
PH: 5.5 (ref 5.0–9.0)
PROTEIN: NEGATIVE mg/dL
SPECIFIC GRAVITY: 1.005 (ref 1.002–1.030)
UROBILINOGEN: NORMAL mg/dL

## 2021-05-06 LAB — CBC WITH DIFF
BASOPHIL #: 0.1 10*3/uL (ref 0.00–2.50)
BASOPHIL %: 1 % (ref 0–3)
EOSINOPHIL #: 0.3 10*3/uL (ref 0.00–2.40)
EOSINOPHIL %: 3 % (ref 0–7)
HCT: 35.6 % — ABNORMAL LOW (ref 37.0–47.0)
HGB: 12.8 g/dL (ref 12.5–16.0)
LYMPHOCYTE #: 1.6 10*3/uL — ABNORMAL LOW (ref 2.10–11.00)
LYMPHOCYTE %: 21 % — ABNORMAL LOW (ref 25–45)
MCH: 30.8 pg (ref 27.0–32.0)
MCHC: 36 g/dL (ref 32.0–36.0)
MCV: 85.7 fL (ref 78.0–99.0)
MONOCYTE #: 0.6 10*3/uL (ref 0.00–4.10)
MONOCYTE %: 7 % (ref 0–12)
MPV: 6.9 fL — ABNORMAL LOW (ref 7.4–10.4)
NEUTROPHIL #: 5.2 10*3/uL (ref 4.10–29.00)
NEUTROPHIL %: 68 % (ref 40–76)
PLATELETS: 187 10*3/uL (ref 140–440)
RBC: 4.16 10*6/uL — ABNORMAL LOW (ref 4.20–5.40)
RDW: 13.3 % (ref 11.6–14.8)
WBC: 7.7 10*3/uL (ref 4.0–10.5)
WBCS UNCORRECTED: 7.7 10*3/uL

## 2021-05-06 LAB — MAGNESIUM: MAGNESIUM: 1.5 mg/dL — ABNORMAL LOW (ref 1.9–2.7)

## 2021-05-06 LAB — TROPONIN-I: TROPONIN I: 4 ng/L (ref <15)

## 2021-05-06 LAB — LAVENDER TOP TUBE

## 2021-05-06 LAB — ECG 12 LEAD: Calculated T Axis: 29 degrees

## 2021-05-06 LAB — THYROID STIMULATING HORMONE (SENSITIVE TSH): TSH: 2.123 u[IU]/mL (ref 0.450–5.330)

## 2021-05-06 LAB — GRAY TOP TUBE

## 2021-05-06 LAB — BLUE TOP TUBE

## 2021-05-06 MED ORDER — MAGNESIUM SULFATE 1 GRAM/100 ML IN DEXTROSE 5 % INTRAVENOUS PIGGYBACK
1.0000 g | INJECTION | INTRAVENOUS | Status: AC
Start: 2021-05-06 — End: 2021-05-06
  Administered 2021-05-06: 0 g via INTRAVENOUS
  Administered 2021-05-06: 1 g via INTRAVENOUS
  Administered 2021-05-06: 0 g via INTRAVENOUS
  Administered 2021-05-06: 1 g via INTRAVENOUS

## 2021-05-06 MED ORDER — MAGNESIUM SULFATE 1 GRAM/100 ML IN DEXTROSE 5 % INTRAVENOUS PIGGYBACK
INJECTION | INTRAVENOUS | Status: AC
Start: 2021-05-06 — End: 2021-05-06
  Filled 2021-05-06: qty 100

## 2021-05-06 MED ORDER — SODIUM CHLORIDE 0.9 % (FLUSH) INJECTION SYRINGE
3.0000 mL | INJECTION | Freq: Three times a day (TID) | INTRAMUSCULAR | Status: DC
Start: 2021-05-06 — End: 2021-05-06

## 2021-05-06 MED ORDER — SODIUM CHLORIDE 0.9 % (FLUSH) INJECTION SYRINGE
3.0000 mL | INJECTION | INTRAMUSCULAR | Status: DC | PRN
Start: 2021-05-06 — End: 2021-05-06

## 2021-05-06 NOTE — Discharge Instructions (Addendum)
See your doctor in 1 week. You will need to have your magnesium level rechecked before Wednesday  Continue your normal medications at home as prescribed by your doctor   Return to ER if there is any emergencies  Slow-Mag twice a day on empty stomach  Your primary care provider needs to order repeat blood work including a BMP with magnesium level in 2 weeks.

## 2021-05-06 NOTE — ED Provider Notes (Addendum)
[facility]  ED Primary Provider Note  Patient Name: Karen Chase  Patient Age: 81 y.o.  Date of Birth: [birth year]    Chief Complaint: Irregular Heartbeat        History of Present Illness       Karen Chase is a 81 y.o. female who had concerns including Irregular Heartbeat.      HPI    Chief complaint:  Weakness  Patient states she has significant generalized weakness, fatigue malaise.  She was sent here by her PCP because of low heart rate in the 40s from Dr. Ruthell Rummage office.  She states that it was not Dr. Ferdinand Lango it was 1 of the PAs.  The patient denies any chest pain or palpitations.  Patient denies any syncope or presyncopal episodes.  She states she does have a sinus infection.  She has went through 2 rounds of antibiotics and she is finally feeling somewhat better.  She denies any dysuria or frequency but she states that she has significant nocturia.  The patient denies any abdominal pain.    Timing: 1 week  Onset:  Gradual   Duration:  Constant and getting worse   Mechanism:  Patient states she does not know why she is feeling bad   Context:  She is currently with her daughter.  She normally lives with her husband  Additional information: She also complains of neck pain in the midline of the lower neck that extends into both arms in the medial aspect of the humerus to the elbow.  Upon significant detailed questioning, it appears the patient spends a lot of time looking at a tablet and/or reading.  This tends to have an anterior head carriage and may be the culprit.    Patient denies any rash or other signs that would lead to zoster.  Patient denies any facial droop, gait failure or slurred speech    Review of Systems    ROS: No other overt Review of Systems are noted to be positive except noted in the HPI.    Social History: Reviewed and listed below    Past Medical History:Reviewed and listed in the chart below.      Past Surgical History: Reviewed and listed on the  chart below.      Family History: Reviewed and listed below    Allergies: Reviewed and listed on the chart    Medications: Reviewed and listed on the chart.    The nursing notes were reviewed and agreed upon unless otherwise stated    The vital signs were reviewed and are listed on the chart        Historical Data   History Reviewed This Encounter: Medical History  Surgical History  Family History  Social History        Physical Exam   ED Triage Vitals [05/06/21 1637]   BP (Non-Invasive) (!) 173/75   Heart Rate 60   Respiratory Rate 18   Temp    SpO2 98 %   Weight 67.6 kg (149 lb)   Height 1.575 m (_0 )         Physical Exam   Constitutional/general:     81 year old well-appearing female who is in no acute distress.  She appears to be uncomfortable but no severe pain.  The patient appears to be much younger than her stated age.  HEENT: [Eyes show normal extraocular movements.  Well-hydrated oral mucosa is noted.  There is no facial trauma or abnormalities.]  Patient has severe hearing loss.  Neck: [No midline tenderness, no meningeal signs.  However, the patient is complaining of midline pain in the lower cervical spine.  Looking at her in the back, she has a slumped posture with an anterior head carriage.  Full range of motion.  No JVD.]  Cardiovascular: Doug Sou is regular rate and rhythm S1-S2 sounds were auscultated without murmur click or rub]  Respiratory: [Lungs are clear to auscultation in all fields without wheeze rale or rhonchi.]  GI: Sharlee Blew is soft non tender normal bowel sounds are auscultated.  There is no rebound tenderness or guarding]  Neuro [cranial nerves II through XII are intact and normal.  Patient has normal speech and normal gait.  There is no muscle weakness in any extremities.  Sensation is intact throughout.  ]  Psych: Alto [place] is alert and oriented person place and time.  Patient is very pleasant converse with has a euthymic affect.  There are no signs of depression or  anxiety.]  Skin: [No rash.  No petechiae or purpura.  The skin is warm and dry without diaphoresis.  There is no pallor.]  Musculoskeletal: [place] Scrape is no tenderness to palpation in any body region.  There is no lower extremity edema and no calf tenderness.  Negative Homans sign bilaterally.]      Procedures none      Patient Data     Labs Ordered/Reviewed   COMPREHENSIVE METABOLIC PANEL, NON-FASTING - Abnormal; Notable for the following components:       Result Value    SODIUM 134 (*)     ANION GAP 8 (*)     ESTIMATED GFR 45 (*)     GLUCOSE 112 (*)     All other components within normal limits    Narrative:     Estimated Glomerular Filtration Rate (eGFR) is calculated using the CKD-EPI (2021) equation, intended for patients 45 years of age and older. If gender is not documented or "unknown", there will be no eGFR calculation.   MAGNESIUM - Abnormal; Notable for the following components:    MAGNESIUM 1.5 (*)     All other components within normal limits   CBC WITH DIFF - Abnormal; Notable for the following components:    RBC 4.16 (*)     HCT 35.6 (*)     MPV 6.9 (*)     LYMPHOCYTE % 21 (*)     LYMPHOCYTE # 1.60 (*)     All other components within normal limits   TROPONIN-I - Normal   THYROID STIMULATING HORMONE (SENSITIVE TSH) - Normal   URINALYSIS, MACRO/MICRO - Normal   CBC/DIFF    Narrative:     The following orders were created for panel order CBC/DIFF.  Procedure                               Abnormality         Status                     ---------                               -----------         ------                     CBC WITH VQQV[[id number]]  Abnormal            Final result                 Please view results for these tests on the individual orders.   BLUE TOP TUBE   GRAY TOP TUBE   URINALYSIS WITH REFLEX MICROSCOPIC AND CULTURE IF POSITIVE    Narrative:     The following orders were created for panel order URINALYSIS WITH REFLEX MICROSCOPIC AND CULTURE IF POSITIVE.  Procedure                                Abnormality         Status                     ---------                               -----------         ------                     URINALYSIS, MACRO/MICRO[[id number]]      Normal              Final result                 Please view results for these tests on the individual orders.     EKG is interpreted by me at 4:48 p.m..  This will be reviewed by cardiologist later time.    Left axis deviation.  Poor R-wave progression across the anterior precordial leads.  Normal PR and QT/QTC intervals.  There are no ST elevations no ST depressions.  Minimal J-point elevation in V1 V2.  No Q-waves noted at this time.  Sinus bradycardia 55 beats per minute noted    No orders to display       Medical Decision Making         MDM    ED Course as of 05/16/21 2226   Fri May 06, 2021   1942 Patient was given 2 g of magnesium, because it.   patient disposition significantly delayed by lack of running the UA.         Medications Administered in the ED   magnesium sulfate 1 G in D5W 100 mL premix IVPB (0 g Intravenous Stopped 05/06/21 2112)       Response to treatment: Patient is feeling much better now.  She states she feels better now than she has in months.  I have discussed the plan with the patient and her daughter.  They will repeat labs including magnesium about 2 weeks.  I will start her on Slow-Mag twice a day during that time.  He will be up to her PCP to order these outpatient labs at that time.                 Discharge Medication List as of 05/06/2021  9:25 PM              ICD-10-CM    1. Hypomagnesemia Active E83.42       2. Other fatigue Active R53.83       3. Cervical strain, acute Active S16.1XXA       4. Type 2 diabetes mellitus without complication, without long-term current use of insulin (CMS HCC) Active E11.9  Discussed with the patient and all questions fully answered. She will call me if any problems arise.    Patient discharged home with family.  AVS reviewed with patient/care giver.  A  written copy of the AVS and discharge instructions was given to the patient/care giver.  Questions sufficiently answered as needed.  Patient/care giver encouraged to follow up with PCP as indicated.  In the event of an emergency, patient/care giver instructed to call 911 or go to the nearest emergency room.

## 2021-05-06 NOTE — ED Triage Notes (Signed)
Bilateral arm pain with left greater than right x2 weeks that started with neck pain and radiated into shoulder and arms.

## 2021-05-07 LAB — ECG 12 LEAD
Atrial Rate: 55 {beats}/min
Atrial Rate: 54 {beats}/min
Calculated P Axis: 79 degrees
Calculated P Axis: 64 degrees
Calculated R Axis: 11 degrees
Calculated R Axis: 16 degrees
Calculated T Axis: 32 degrees
PR Interval: 196 ms
PR Interval: 206 ms
QRS Duration: 76 ms
QRS Duration: 80 ms
QT Interval: 456 ms
QT Interval: 452 ms
QTC Calculation: 436 ms
QTC Calculation: 428 ms
Ventricular rate: 55 {beats}/min
Ventricular rate: 54 {beats}/min

## 2021-05-23 DIAGNOSIS — G5702 Lesion of sciatic nerve, left lower limb: Secondary | ICD-10-CM | POA: Insufficient documentation

## 2021-05-23 DIAGNOSIS — M25552 Pain in left hip: Secondary | ICD-10-CM | POA: Insufficient documentation

## 2021-05-23 DIAGNOSIS — M7551 Bursitis of right shoulder: Secondary | ICD-10-CM | POA: Insufficient documentation

## 2021-05-23 DIAGNOSIS — M25512 Pain in left shoulder: Secondary | ICD-10-CM | POA: Insufficient documentation

## 2021-06-01 DIAGNOSIS — R55 Syncope and collapse: Secondary | ICD-10-CM | POA: Insufficient documentation

## 2021-07-07 DIAGNOSIS — M7542 Impingement syndrome of left shoulder: Secondary | ICD-10-CM | POA: Insufficient documentation

## 2021-07-07 DIAGNOSIS — M19011 Primary osteoarthritis, right shoulder: Secondary | ICD-10-CM | POA: Insufficient documentation

## 2021-07-22 IMAGING — MR MRI SHOULDER LT W/O CONTRAST
6 series · 38 of 40 positions shown · IV contrast (gadolinium)
Comparison: None available.

﻿EXAM:  [id number]   MRI SHOULDER LT W/O CONTRAST
INDICATION: Pain and decreased range of motion.
TECHNIQUE: Multiplanar multisequential MRI of the left shoulder joint was performed without gadolinium contrast.

[Series 6: T1 · oblique · left · 3.5mm · 0.33mm/px · 7 of 18 slices shown]
[im 1/18]
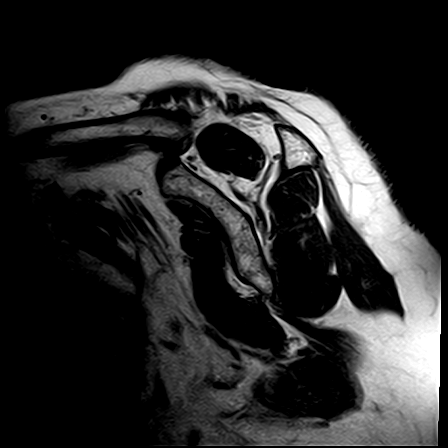
[im 3/18]
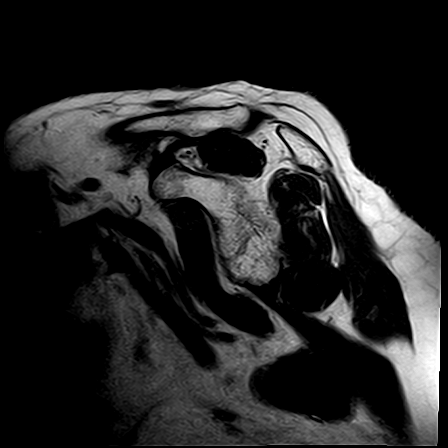
[im 6/18]
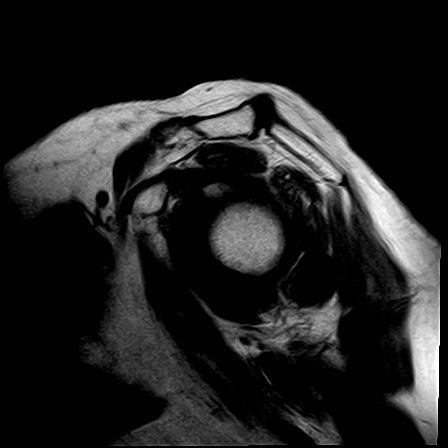
[im 9/18]
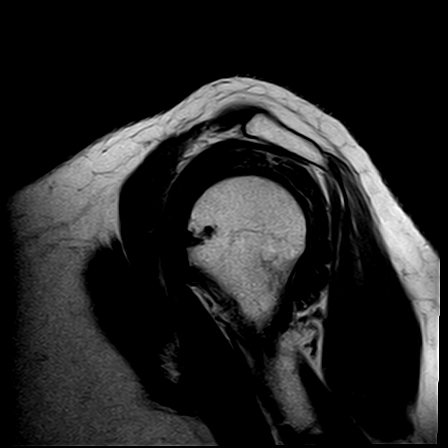
[im 12/18]
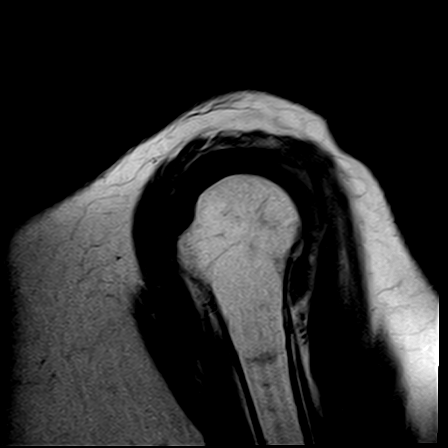
[im 15/18]
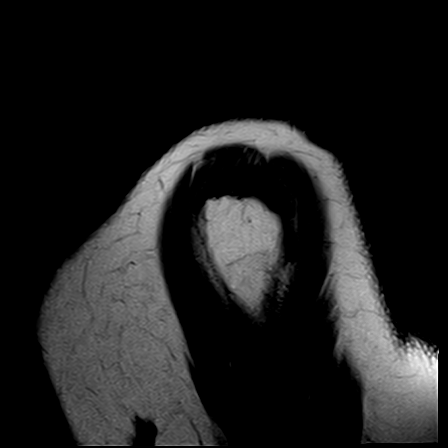
[im 18/18]
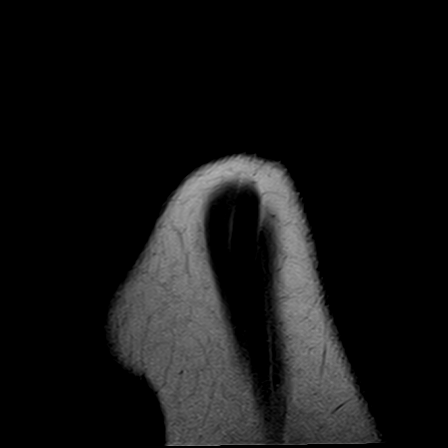

[Series 7: STIR · oblique · left · 3.5mm · 0.47mm/px · 7 of 18 slices shown (1 of 2)]
[im 1/18]
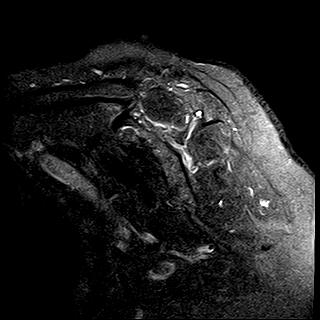
[im 3/18]
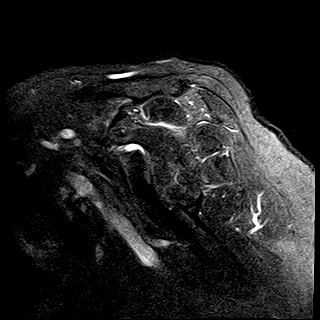
[im 6/18]
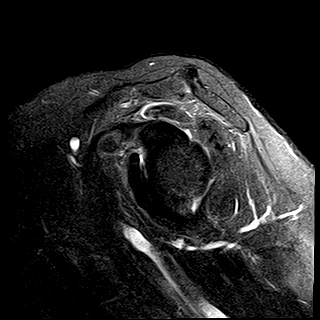
[im 9/18]
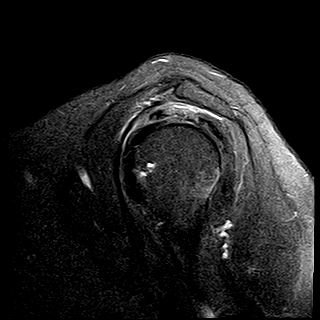
[im 12/18]
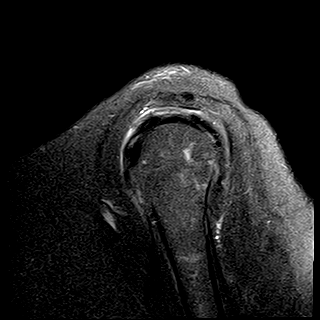
[im 15/18]
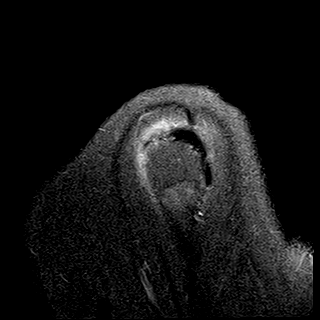
[im 18/18]
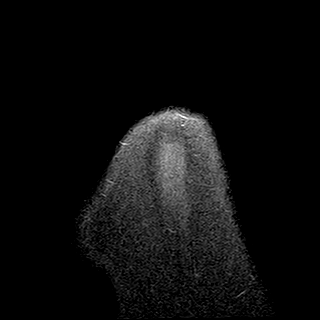

[Series 8: PD fat-sat · axial · left · 4.0mm · 0.50mm/px · z∈[-45,+32]mm · 6 of 18 slices shown (1 of 2)]
[im 1/18]
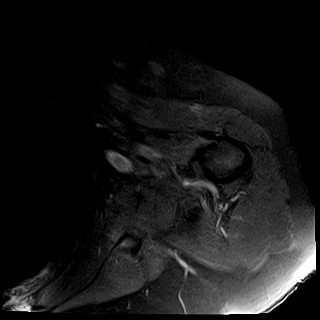
[im 4/18]
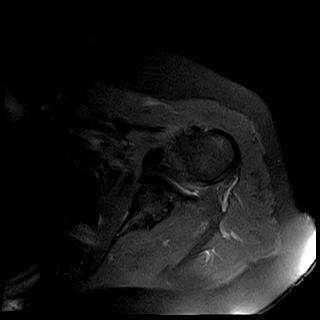
[im 7/18]
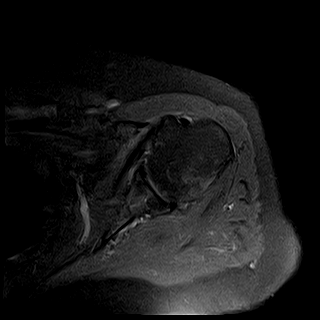
[im 11/18]
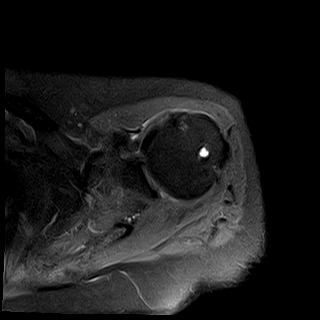
[im 14/18]
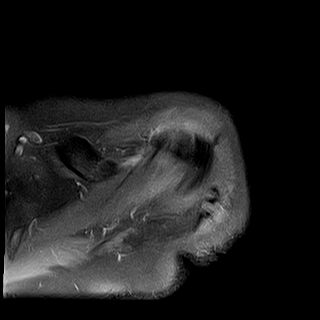
[im 18/18]
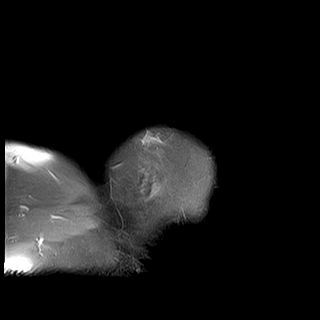

[Series 9: T2 fat-sat · axial · left · 4.0mm · 0.42mm/px · z∈[-58,+45]mm · 8 of 24 slices shown]
[im 1/24]
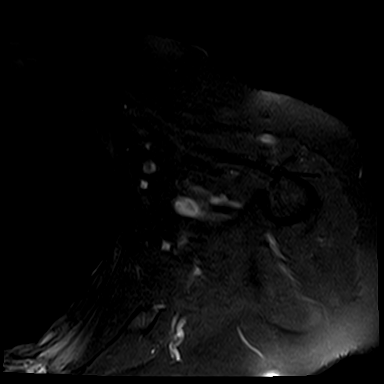
[im 4/24]
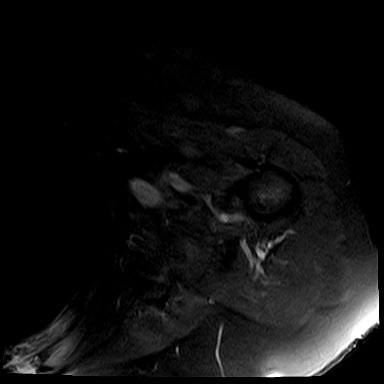
[im 7/24]
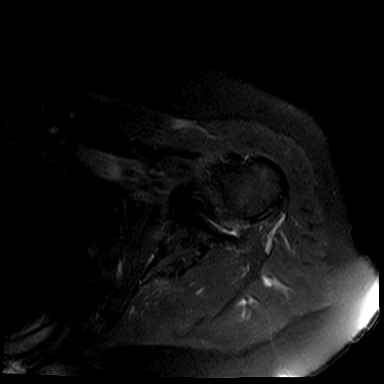
[im 10/24]
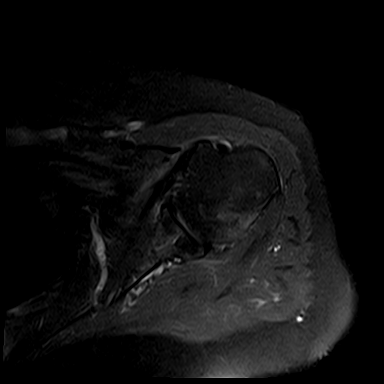
[im 14/24]
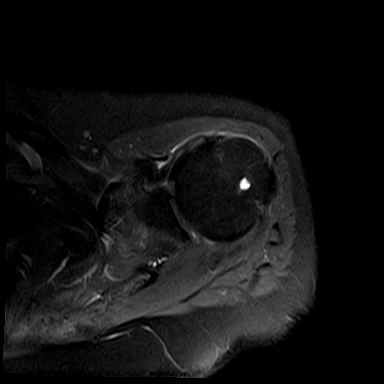
[im 17/24]
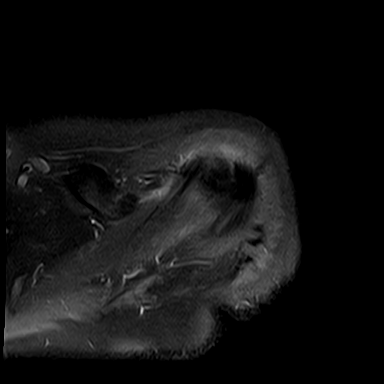
[im 20/24]
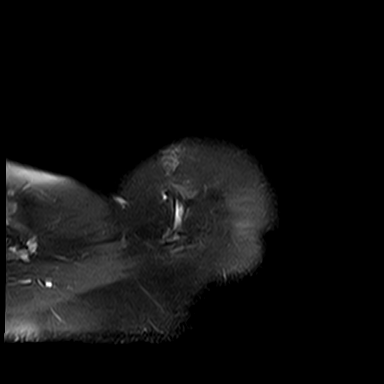
[im 24/24]
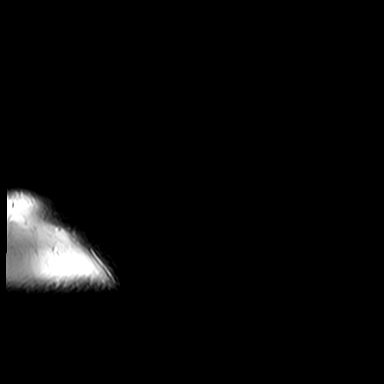

[Series 10: PD fat-sat · oblique · left · 3.5mm · 0.47mm/px · 6 of 18 slices shown (2 of 2)]
[im 1/18]
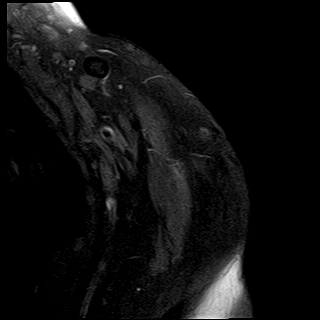
[im 4/18]
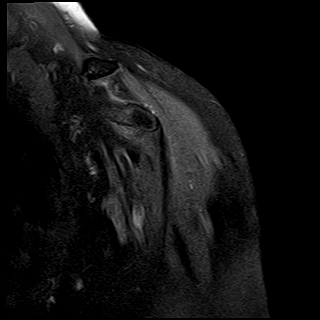
[im 7/18]
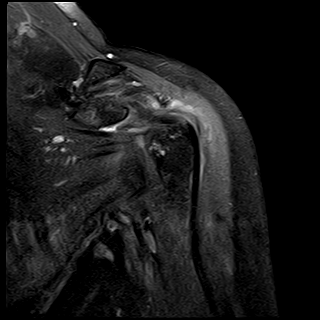
[im 11/18]
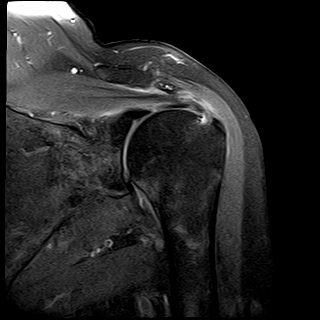
[im 14/18]
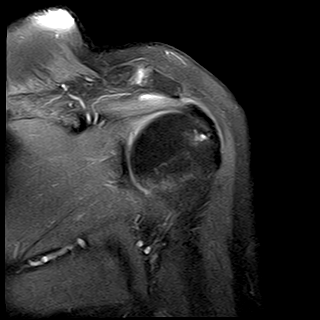
[im 18/18]
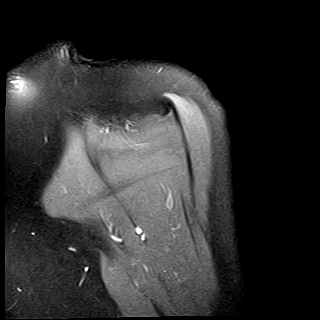

[Series 11: STIR · oblique · left · 3.5mm · 0.47mm/px · 4 of 18 slices shown (2 of 2)]
[im 1/18]
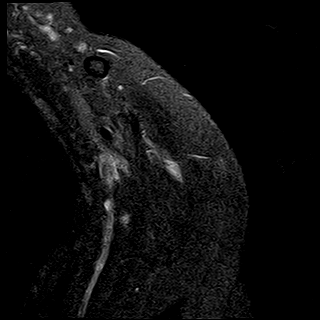
[im 4/18]
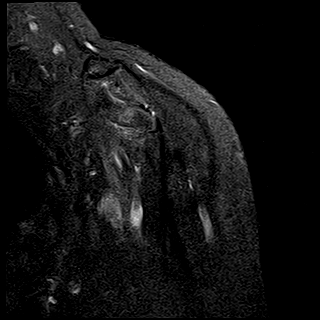
[im 7/18]
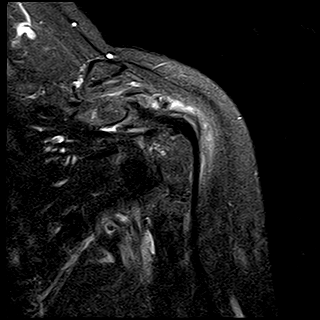
[im 11/18]
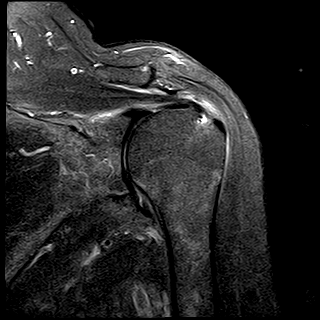

[38 of 40 positions shown; findings below may reference images not displayed]

FINDINGS: There is mild supraspinatus tendinopathy. Infraspinatus, teres minor and subscapularis muscles and tendons are within normal limits in morphology and signal intensity.  Long head of biceps tendon is well seated within the intertubercular groove and attaches normally to the biceps anchor.  Superior labrum is intact. Glenohumeral articular cartilage is well maintained.  There is no significant fluid within the subacromial/subdeltoid bursa.  There is moderate acromioclavicular joint osteoarthritis. Muscle bulk and bone marrow signal intensity are normal.  No mass is seen along the course of the suprascapular nerve, within the spinoglenoid notch or within the quadrilateral space.
IMPRESSION: 1. Mild supraspinatus tendinopathy.  

2. Moderate acromioclavicular joint osteoarthritis.

## 2021-07-28 DIAGNOSIS — S46819A Strain of other muscles, fascia and tendons at shoulder and upper arm level, unspecified arm, initial encounter: Secondary | ICD-10-CM | POA: Insufficient documentation

## 2021-07-28 DIAGNOSIS — M2550 Pain in unspecified joint: Secondary | ICD-10-CM | POA: Insufficient documentation

## 2021-08-17 ENCOUNTER — Other Ambulatory Visit (HOSPITAL_COMMUNITY): Payer: Self-pay | Admitting: Cardiovascular Disease

## 2021-08-17 DIAGNOSIS — R001 Bradycardia, unspecified: Secondary | ICD-10-CM

## 2021-08-17 DIAGNOSIS — R55 Syncope and collapse: Secondary | ICD-10-CM

## 2021-08-24 ENCOUNTER — Other Ambulatory Visit: Payer: Self-pay

## 2021-08-24 ENCOUNTER — Inpatient Hospital Stay
Admission: RE | Admit: 2021-08-24 | Discharge: 2021-08-24 | Disposition: A | Discharge status: Home / Self Care | Payer: 59 | Source: Ambulatory Visit | Attending: Cardiovascular Disease | Admitting: Cardiovascular Disease

## 2021-08-24 DIAGNOSIS — R001 Bradycardia, unspecified: Secondary | ICD-10-CM | POA: Insufficient documentation

## 2021-08-24 DIAGNOSIS — R55 Syncope and collapse: Secondary | ICD-10-CM | POA: Insufficient documentation

## 2021-09-09 LAB — 3 DAY EXTENDED HOLTER MONITOR
Heart rate (average): 73 {beats}/min
Isolated SVE count: 376 episodes
Longest supraventricular tachycardia episode - duration: 6.4 s
Longest supraventricular tachycardia episode - heart rate (: 136 {beats}/min
Longest supraventricular tachycardia episode - number of be: 14 beats
SVE Couplets Counts: 30 episodes
SVE Triplets Counts: 7 episodes
Supraventricular tachycardia - heart rate (average): 126 {beats}/min
Supraventricular tachycardia - number of episodes: 8
Supraventricular tachycardia with fastest heart rate - dura: 4 s
Supraventricular tachycardia with fastest heart rate - hear: 140 {beats}/min
Supraventricular tachycardia with fastest heart rate - numb: 9 beats

## 2022-03-09 IMAGING — DX XRAY CHEST 2 VIEWS
1 series · 3 of 3 positions shown · non-contrast
Comparison: None available.

﻿EXAM:  [id number]   XRAY CHEST 2 VIEWS
INDICATION: Cough and congestion.  History of left thoracotomy. No history of cancer.
TECHNIQUE: Two views of the chest.

[Series 1: PA · 0.14mm/px · 3 of 3 slices shown]
[im 1/3]
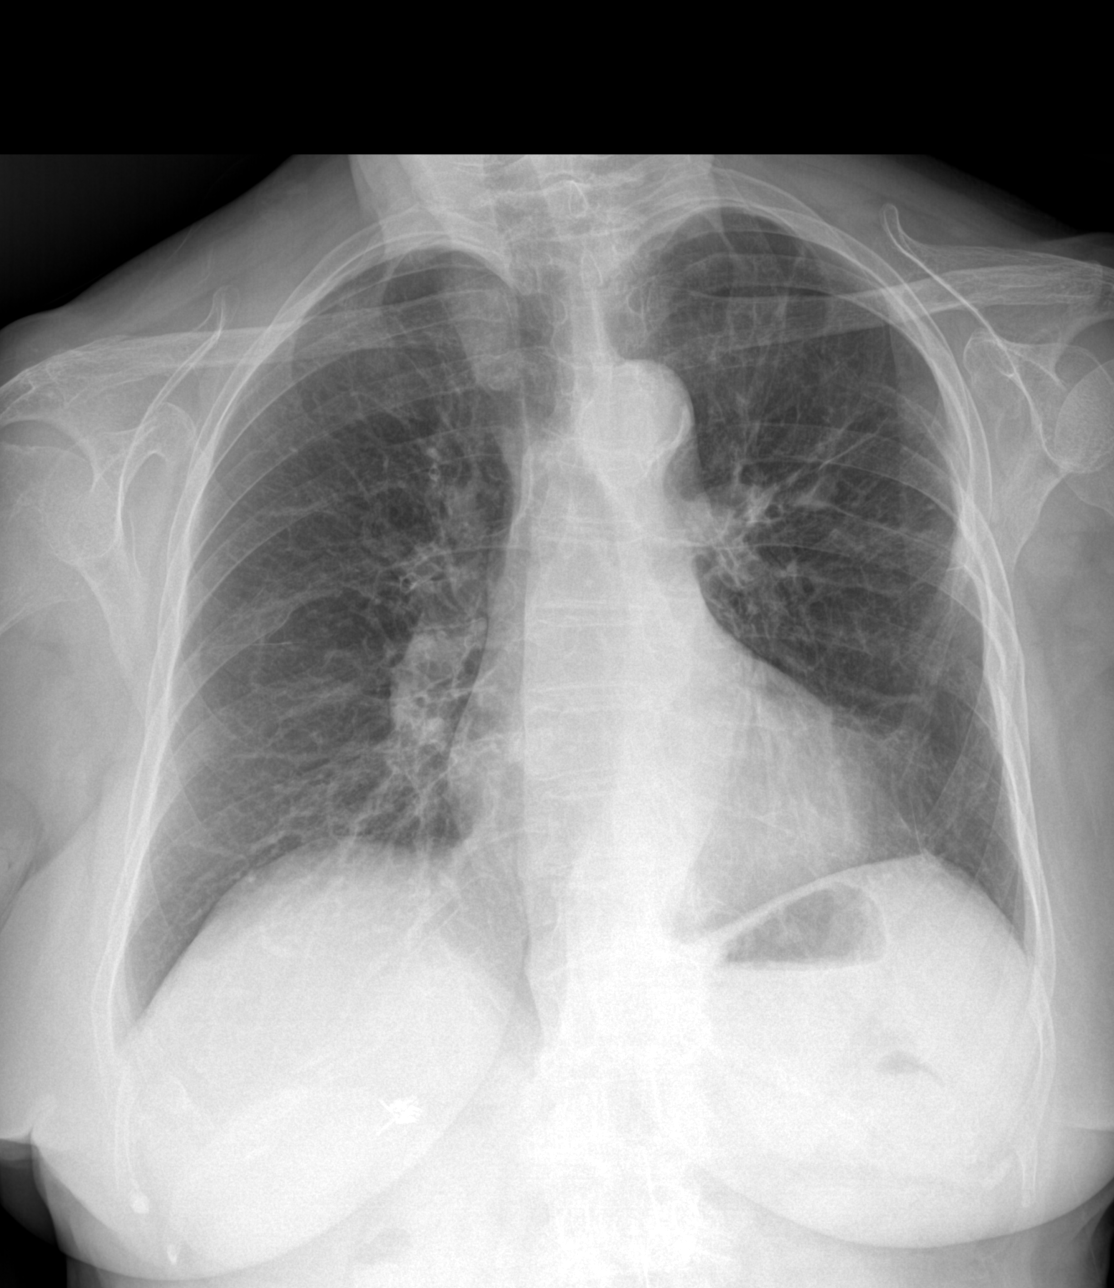
[im 2/3]
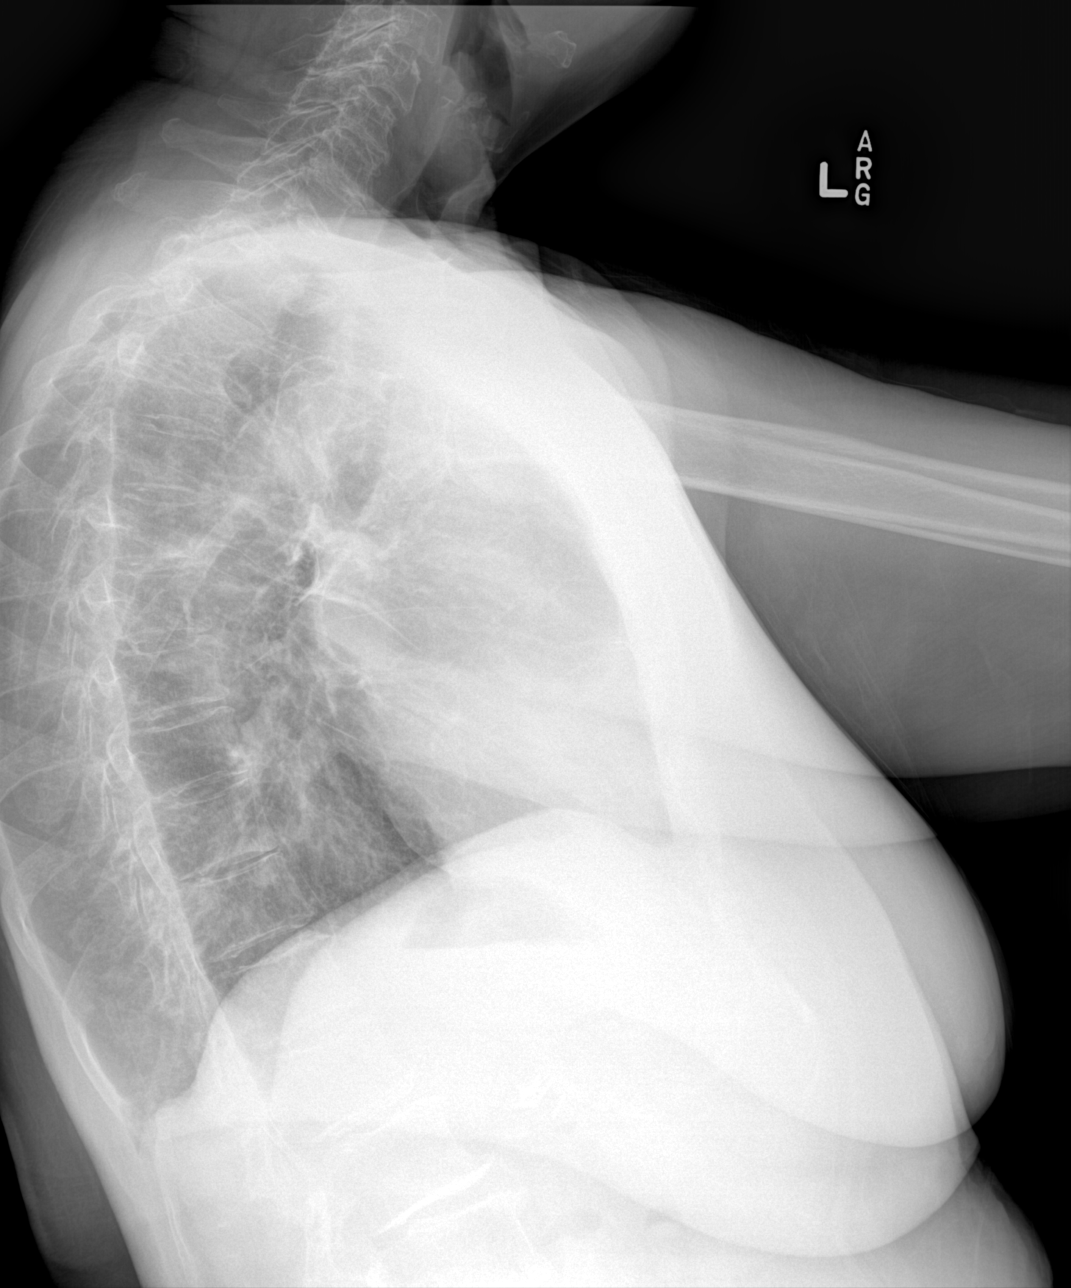
[im 3/3]
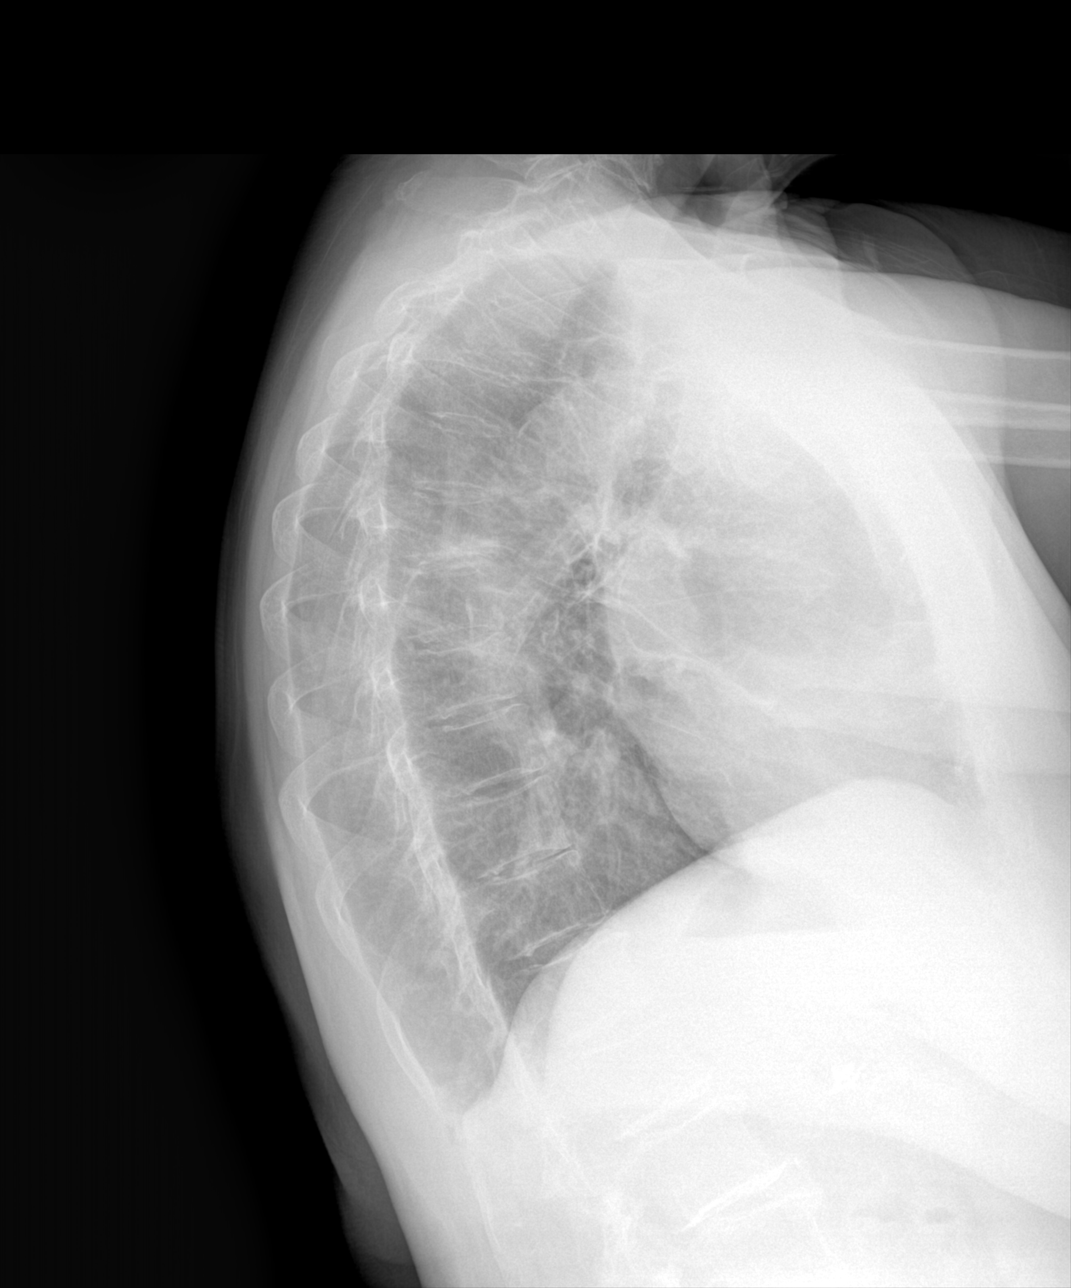

[3 of 3 positions shown; findings below may reference images not displayed]

FINDINGS: Moderate cardiomegaly and significant atherosclerotic aorta.  Lungs are free of acute process. Postoperative changes of left hemithorax are noted.  Hila and mediastinum are normal.
IMPRESSION: Postsurgical changes of left chest. Cardiomegaly, atherosclerotic aorta.  No acute pulmonary findings in the two views of the chest.

## 2022-03-23 DIAGNOSIS — R911 Solitary pulmonary nodule: Secondary | ICD-10-CM | POA: Insufficient documentation

## 2022-04-06 NOTE — Discharge Summary (Signed)
 -------------------------------------------------------------------------------  Attestation signed by Raj Cindie HERO, MD at 04/06/2022  3:23 PM  For this patient encounter, I was the Clinical Oversight Physician. This includes discussion of the plan of care for this patient and independent examination.     Cindie HERO Raj, MD    -------------------------------------------------------------------------------      Physician/Midlevel Provider Discharge Summary  [facility]   Patient Name: Karen Chase   Patient Age: 82 y.o.   Patient DOB: December 27, 1940   MRN: [id number]   PCP: Harl Barnie LABOR   Admit date: 03/23/2022   Discharge date:  03/25/2022   Admitting Physician: Cindie HERO Raj, MD    Admission Diagnoses: Lung nodule [R91.1]   Discharge Physician: Odella LITTIE Hales, PA   Discharge Diagnoses:  Principal Problem:    Lung nodule  Active Problems:    COVID      Consults:  none   Procedures: none     Admission History     Karen Chase is a 82 y.o. female who presents to the ED for complaint of SOB, dizziness, numbness. Pt w/ past medical hx significant for hypertension. Pt to ED c/o SOB, dizziness, and numbness in bilateral lower extremities. She reports she had gone to see her cardiologist Dr. Christin and was advised to come to the ED due to weakness and nausea. She reports that for the past 2x yrs she has had intermittent episodes of being sick. She reports that her current episode has been ongoing since Thanksgiving w/ weakness, dizziness, nausea, and cough. Pt had been seen at this ED on 1/17 c/o similar symptoms. Labs and chest xray obtained at that visit returned negative, and pt was given an albuterol  inhaler and started on doxycycline and prednisone. Since then she reports her cough has improved but she continues to feel dizzy and weak. She states the dizziness is intermittent, and reports that it seems to occur randomly. She reports that she does feel generally weak  w/ increased fatigue. On arrival to ED she reports that the dizziness and weakness have improved some from this AM. Additionally, pt also states that she had a syncopal episode 2 weeks ago after standing up, without any injury. She denies fever, chills, diarrhea, abdominal pain, chest pain, hematuria, dysuria. Overall poor historian, not able to give specifics very well or describe exactly how she is feeling now or that brought her in, but does not seem confused or altered.  See H&P for further details  Hospital Course     Subjective   -  Alert and pleasant - NAD.  VSS.  Labvs eseentailly WNL.  Work up other then initial CT with metastaic lung nodules esentially unremarkable. further CTs/ radiology were WITHOUT evidence of cancerous or metastatic dx.  Pt understanding of need for further outpt discussion and work up via PCP.   Refuses SNF.  Will order [facility] and PT/OT.  Discussed with pts daughter as well.  Home today - return to ED as needed      Pt denies any other neurological, cardiopulmonary, GI, or urinary symptoms.    Objective  Blood pressure (!) 166/79, pulse 73, temperature 97.7 F (36.5 C), resp. rate 26, height 1.575 m (5' 2), weight 54.2 kg (119 lb 8 oz), SpO2 93%.    Constitutional: Oriented to person, place, and time. Appears well-developed and well-nourished. No distress.   Head: Normocephalic and atraumatic.   Neck: Neck supple. Trachea midline.  No JVD  Cardiovascular: RRR  - No murmur heard.  Pulmonary/Chest: Effort normal and breath sounds normal. No respiratory distress. CTA.  On RA  Abdominal: SNTND - normal BS. No masses. There is no rebound and no guarding.   Musculoskeletal: General: No edema. FROM  Neurological: Alert and oriented to person, place, and time. CN intact  Skin: Skin is warm and dry. Not diaphoretic.   Psychiatric: Normal mood and affect. Behavior is normal.     Assessment/Plan During Hospital Course  Lung nodule  Bradycardia:acuteillness or injury  Dysphagia, unspecified  type:acuteillness or injury  Lung nodules:acuteillness or injury  Shortness of breath:acuteillness or injury  Syncope and collapse:acuteillness or injury  Weakness:acuteillness or injury      Patient presented complaining of multiple vague complaints and feeling sick for many months. She has shortness of breath, occasional dizziness and syncope, bilateral lower extremity numbness. Dizziness nausea and difficulty swallowing and funny sensation in her throat. She has had intermittent cough. Hypertensive on arrival however blood pressures decreased, she was slightly bradycardic multiple times on the monitor with heart rate in the 40s to 50s. She has some mild bilateral lower extremity edema but otherwise exam benign. Her D-dimer was elevated at 1.65, a CTA chest was obtained which showed no PE but did show multiple bilateral pulmonary nodules up to 8 mm concerning for metastases. CT head showed no acute abnormality. Urinalysis, troponins, BNP, CBC, BMP all unremarkable other than elevated creatinine of 1.18, receiving some fluids. Orthostatic vital signs negative. Admitting to the hospital for further workup for bradycardia, dizziness and syncope, and new pulmonary nodules including CT abdomen pelvis tomorrow with contrast, CT neck and soft tissue, echocardiogram, TSH, B12, folate, CEA, 19-9, physical therapy and speech/swallow evaluation. NS overnight for AKI    Chronic conditions: Resume home medications for chronic conditions. Holding metformin  due to contrast, holding metoprolol  due to bradycardia    1/26 -pt asumptomatc - still with back pain - further wok up pending - continue monitor and educate.  HR in the 60-70s.  BP elevated.  Will follow.  Discussed dispo options pending results.   1/27 - up and moving - feels improved today.  HR slightly improved still 60-70s.  Asymptomatic.  Educated on medication change.  Adding cozaar  for BP control without rate lowering issue.  No obvious signs  of cancer or metastatic dz.  Explained and discussed but will need to follow up PCP for further testing.  ECHO as below.  PCP to consider carotids as not completed here.  Pt understanding.  Meds as directed.  HH for home needs.   Return to ED as needed for any new or worsening symptoms.                                  She  was provided with disease education and aftercare instruction.  At the time of discharge, her symptoms and clinical measures of illness have improved.    Lab & Imaging Results     CBC:   Lab Results   Component Value Date    WBC 4.0 03/25/2022    RBC 3.30 (L) 03/25/2022    HGB 10.0 (L) 03/25/2022    HCT 30.5 (L) 03/25/2022    PLT 124 (L) 03/25/2022     CBC w Diff:   Lab Results   Component Value Date    WBC 4.0 03/25/2022    HGB 10.0 (L) 03/25/2022    HCT 30.5 (L) 03/25/2022    PLT 124 (  L) 03/25/2022    LYMPHOPCT 21.8 03/25/2022    BANDSPCT 2 06/02/2021    MONOPCT 10.8 03/25/2022    EOSPCT 5.0 03/25/2022    BASOPCT 0.3 03/25/2022     BMP:   Lab Results   Component Value Date    GLU 129 (H) 03/25/2022    NA 140 03/25/2022    K 3.9 03/25/2022    CL 106 03/25/2022    CO2 25 03/25/2022    BUN 13 03/25/2022    CREATININE 0.89 03/25/2022    CA 8.8 03/25/2022     CMP:   Lab Results   Component Value Date    NA 140 03/25/2022    K 3.9 03/25/2022    CL 106 03/25/2022    CO2 25 03/25/2022    BUN 13 03/25/2022    CREATININE 0.89 03/25/2022    GLU 129 (H) 03/25/2022    PROT 6.3 03/23/2022    ALBUMIN 3.6 03/23/2022    CA 8.8 03/25/2022    BILITOT 0.7 03/23/2022    ALKPHOS 50 03/23/2022    AST 54 (H) 03/23/2022    ALT 79 (H) 03/23/2022    GLO 2.7 03/23/2022    MG 2.0 03/23/2022     Coagulation:   Lab Results   Component Value Date    PROTIME 10.4 03/15/2022    INR 1.0 03/15/2022    PTT 24.6 03/15/2022     BNP:   Lab Results   Component Value Date    BNP 373.0 03/23/2022       CXR  IMPRESSION: No acute findings in the Limited portable chest x-ray. Mild cardiomegaly and significant atherosclerotic aorta.  Deformity of posterior left fourth rib, possibly due to previous surgery or congenital anomaly      CT Head  IMPRESSION:  No evidence of an acute cardiopulmonary process.          CTA  IMPRESSION:    1. Multiple bilateral pulmonary nodules measuring up to 8 mm.. Findings may represent metastases.  Recommend comparison to prior chest CT  2. No acute pulmonary embolism to the segmental level.   3. No evidence of aortic aneurysm or dissection.   4. Hepatic steatosis.    MRI   IMPRESSION:   1. No acute ischemia. Minimal chronic small vessel ischemic change of periventricular deep white matter.  2. No intracranial space-occupying lesions, ventriculomegaly or midline shift.  3. Sinusitis and inflammatory changes of right mastoid cells and middle ear cavity as described above.    CT ABD/PELVIS  IMPRESSION:  1.  No evidence of abdominal mass or abscess.  2.  Thickened urinary bladder wall, possibly due to underdistention or acute cystitis.      ECHO  Summary   1. Overall left ventricular ejection fraction is estimated at 65 to 70%.   2. Normal global left ventricular systolic function.   3. Mild mitral valve regurgitation.            Pending Test Results      Final cultures - defer to PCP    Disposition         Patient discharged to: Home with home health     Patient Instructions   Follow Up:  with  Harl Sober A in 3-5 days for general evaluation and recheck.  Labs as needed.  Educated on discharge meds.  Instructed to return to the ED For any new or worsening symptoms.   Discharge Activity:  activity as tolerated     Discharge  Diet:  Cardiac Diet     Special Instructions:  Call your primary care physician or return to the Emergency Department if you have new or worsening symptoms.  Call your primary care physician if you have any questions about or problems with your medications.   Discharge Medications:       Medication List      CHANGE how you take these medications    losartan  50 mg Tab  Commonly known as:  COZAAR   take 1 tablet every day by mouth .  What changed:   medication strength  how much to take        CONTINUE taking these medications    B Complex-Vitamin C-Folic Acid  0.8 mg Tab  Commonly known as: NEPHRO-VITE  take 1 tablet by mouth every day.     Combivent Respimat 20-100 mcg/actuation Mist  Generic drug: ipratropium-albuteroL   take 1 puff in the morning and 1 puff at noon and 1 puff in the evening and 1 puff before bedtime by inhalation.     Diltiazem  HCl 240 mg Tb24  take 1 tablet by mouth every day     doxycycline 100 mg Cap  Commonly known as: VIBRAMYCIN  take 1 capsule in the morning and 1 capsule before bedtime by mouth.     ergocalciferol 1,250 mcg (50,000 unit) Cap  Commonly known as: ERGOCALCIFEROL  take 50,000 Units once a week by mouth .     guaiFENesin LA 600 mg Ta12  Commonly known as: HUMIBID  take 1 tablet every 12 (twelve) hours by mouth .     LORazepam  1 mg Tab  Commonly known as: ATIVAN   take 1 mg in the morning and 1 mg at noon and 1 mg before bedtime by mouth.     magnesium  oxide 400 mg magnesium  Tab  take 1 tablet every day by mouth .     metFORMIN  500 mg Tab  Commonly known as: GLUCOPHAGE   take 500 mg in the morning and 500 mg in the evening by mouth. take with meals.     Miralax powder  Generic drug: polyethylene glycol  take 17 g by mouth every morning.     pantoprazole  40 mg Tbec  Commonly known as: PROTONIX   take 40 mg every day by mouth .     predniSONE 5 mg Dspk  Please follow the daily instructions that come with the medication package..     sertraline  100 mg Tab  Commonly known as: ZOLOFT   take 1 tablet every day by mouth .     vitamin D3-vitamin K2 1,250-200 mcg Cap  take 1 capsule by mouth one time only.        STOP taking these medications    acyclovir 200 mg Cap  Commonly known as: ZOVIRAX     dextromethorphan-guaiFENesin 10-100 mg/5 mL Syrp  Commonly known as: ROBITUSSIN DM     metoprolol  tartrate 25 mg Tab  Commonly known as: LOPRESSOR            Where to Get Your  Medications      These medications were sent to [facility] [address], [place] - [address]  [address], [place] [address]    Phone: [phone]   losartan  50 mg Tab  predniSONE 5 mg Dspk          Total time in preparing paper work, data evaluation and todays exam-35 minutes  Seen and evaluated with Dr. Raj.     Signed: Odella LITTIE Hales,  PA  03/26/2022  12:56 PM

## 2022-05-16 DIAGNOSIS — M533 Sacrococcygeal disorders, not elsewhere classified: Secondary | ICD-10-CM | POA: Insufficient documentation

## 2022-07-17 DIAGNOSIS — E782 Mixed hyperlipidemia: Secondary | ICD-10-CM | POA: Insufficient documentation

## 2022-07-17 DIAGNOSIS — M069 Rheumatoid arthritis, unspecified: Secondary | ICD-10-CM | POA: Insufficient documentation

## 2022-07-17 DIAGNOSIS — D649 Anemia, unspecified: Secondary | ICD-10-CM | POA: Insufficient documentation

## 2022-07-17 DIAGNOSIS — E119 Type 2 diabetes mellitus without complications: Secondary | ICD-10-CM | POA: Insufficient documentation

## 2022-07-17 DIAGNOSIS — E559 Vitamin D deficiency, unspecified: Secondary | ICD-10-CM | POA: Insufficient documentation

## 2022-07-17 DIAGNOSIS — I10 Essential (primary) hypertension: Secondary | ICD-10-CM | POA: Insufficient documentation

## 2022-07-17 DIAGNOSIS — R32 Unspecified urinary incontinence: Secondary | ICD-10-CM | POA: Insufficient documentation

## 2022-07-28 DIAGNOSIS — M545 Low back pain, unspecified: Secondary | ICD-10-CM | POA: Insufficient documentation

## 2022-07-28 DIAGNOSIS — R102 Pelvic and perineal pain: Secondary | ICD-10-CM | POA: Insufficient documentation

## 2022-07-28 DIAGNOSIS — N898 Other specified noninflammatory disorders of vagina: Secondary | ICD-10-CM | POA: Insufficient documentation

## 2022-08-16 IMAGING — MR MRI BRAIN W/O CONTRAST
9 series · 48 of 48 positions shown · IV contrast (gadolinium)
Comparison: None available.

﻿EXAM:  MRI BRAIN W/O CONTRAST
INDICATION: 82-year-old with headache and dementia.
TECHNIQUE: Multiplanar, multisequential MRI of the brain was performed without gadolinium contrast.

[Series 5: DWI · axial · 5.0mm · 1.35mm/px · z∈[-88,+38]mm · 15 of 88 slices shown (1 of 3)]
[im 1/88]
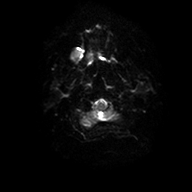
[im 7/88]
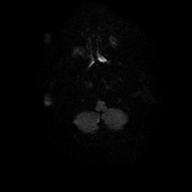
[im 13/88]
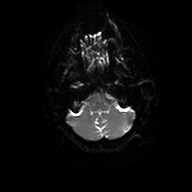
[im 19/88]
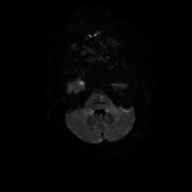
[im 25/88]
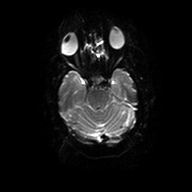
[im 32/88]
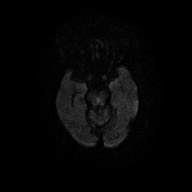
[im 38/88]
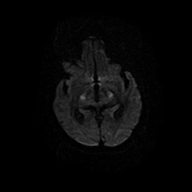
[im 44/88]
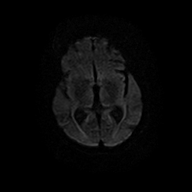
[im 50/88]
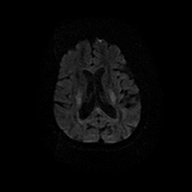
[im 56/88]
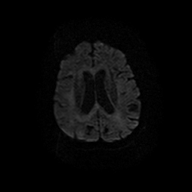
[im 63/88]
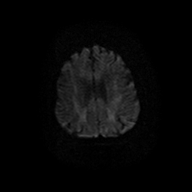
[im 69/88]
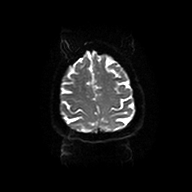
[im 75/88]
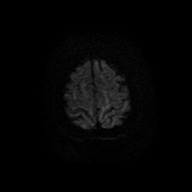
[im 81/88]
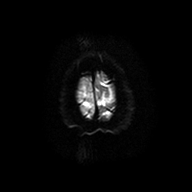
[im 88/88]
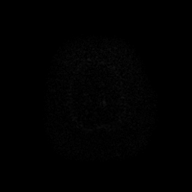

[Series 6: DWI · axial · 5.0mm · 1.35mm/px · z∈[-88,+38]mm · 4 of 22 slices shown (2 of 3)]
[im 1/22]
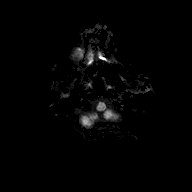
[im 8/22]
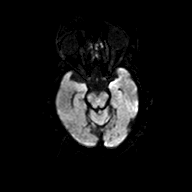
[im 15/22]
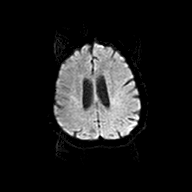
[im 22/22]
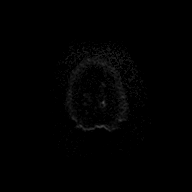

[Series 7: DWI · axial · 5.0mm · 1.35mm/px · z∈[-88,+38]mm · 4 of 22 slices shown (3 of 3)]
[im 1/22]
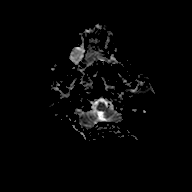
[im 8/22]
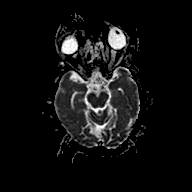
[im 15/22]
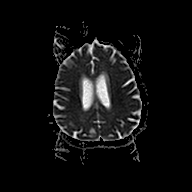
[im 22/22]
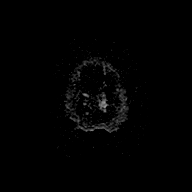

[Series 8: FLAIR · sagittal · 4.0mm · 0.75mm/px · 5 of 26 slices shown (1 of 2)]
[im 1/26]
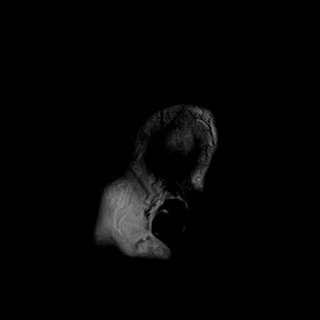
[im 7/26]
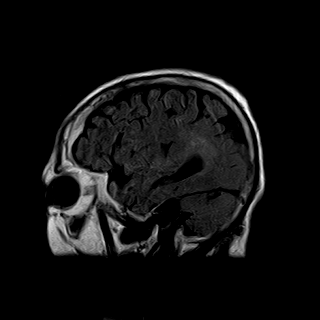
[im 13/26]
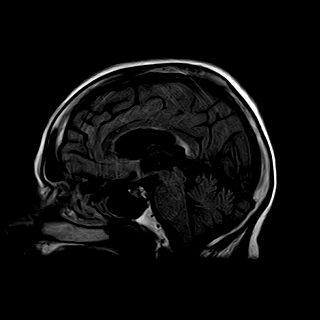
[im 19/26]
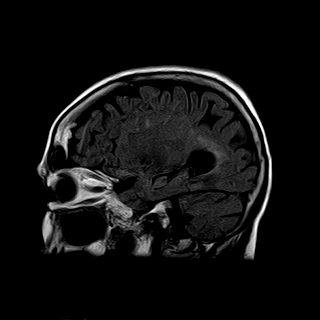
[im 26/26]
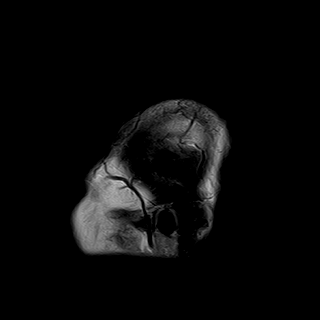

[Series 9: T2 · axial · 5.0mm · 0.43mm/px · z∈[-96,+48]mm · 4 of 25 slices shown (1 of 2)]
[im 1/25]
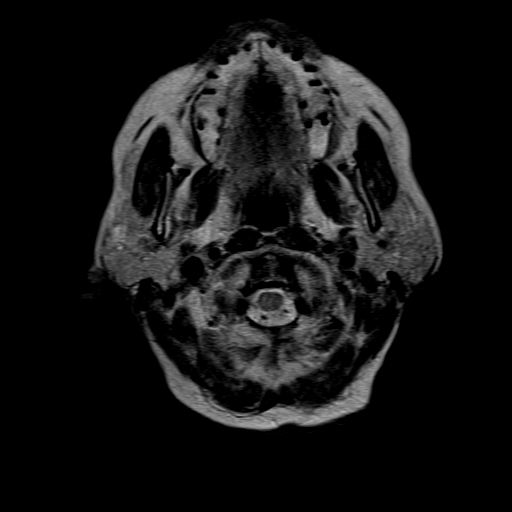
[im 9/25]
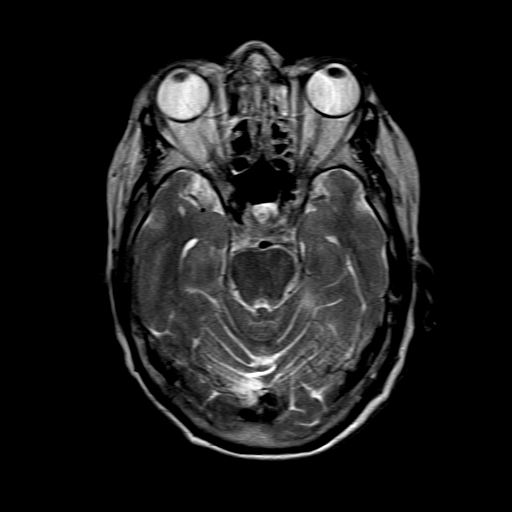
[im 17/25]
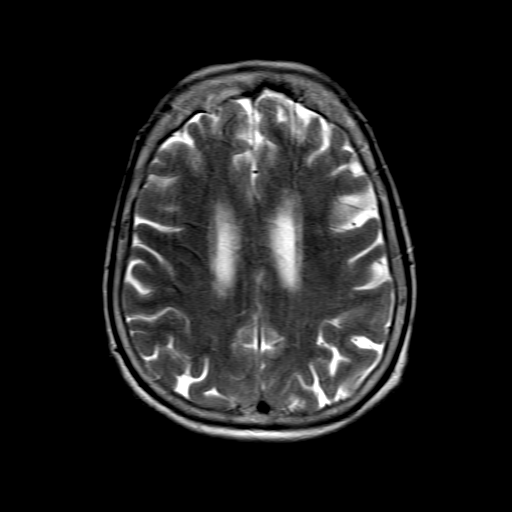
[im 25/25]
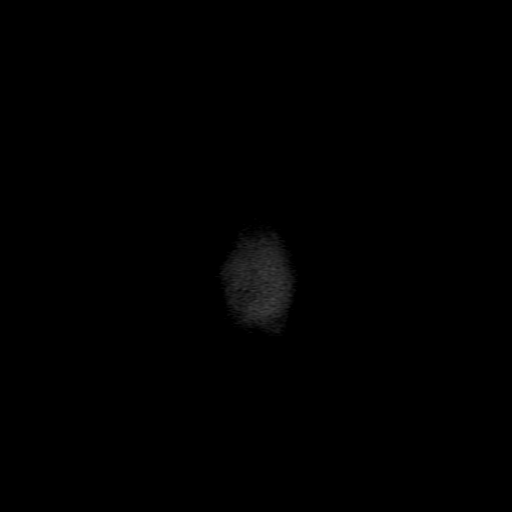

[Series 10: FLAIR · axial · 5.0mm · 0.76mm/px · z∈[-87,+39]mm · 4 of 22 slices shown (2 of 2)]
[im 1/22]
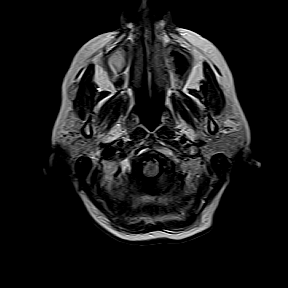
[im 8/22]
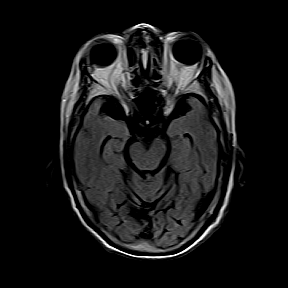
[im 15/22]
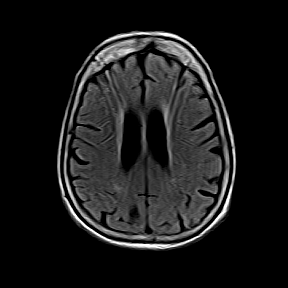
[im 22/22]
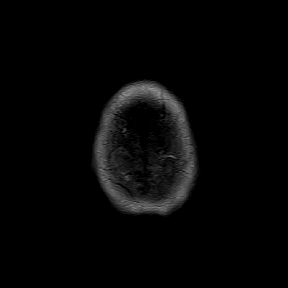

[Series 11: T1 · axial · 5.0mm · 0.69mm/px · z∈[-87,+39]mm · 4 of 22 slices shown]
[im 1/22]
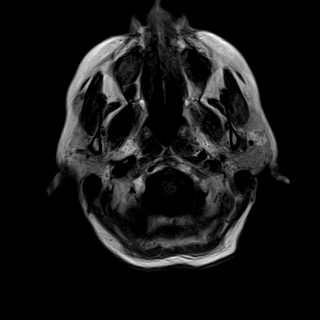
[im 8/22]
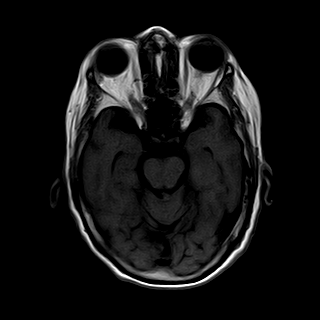
[im 15/22]
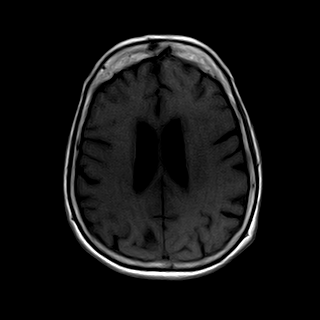
[im 22/22]
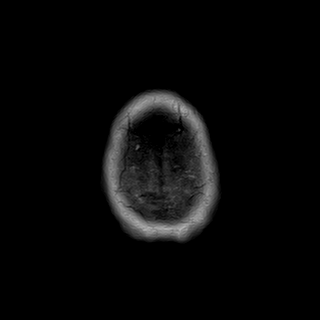

[Series 12: T2-star · axial · 5.0mm · 0.69mm/px · z∈[-87,+39]mm · 4 of 22 slices shown]
[im 1/22]
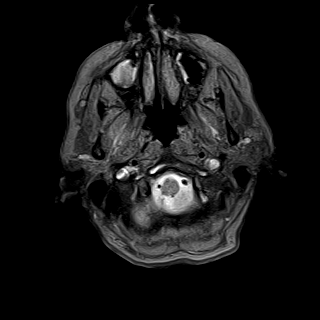
[im 8/22]
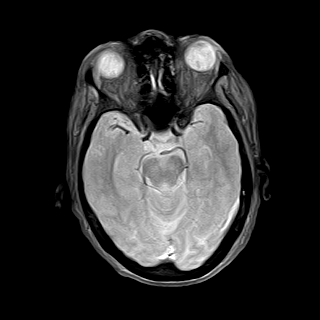
[im 15/22]
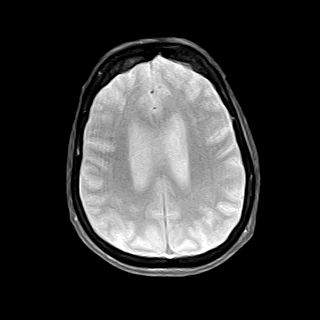
[im 22/22]
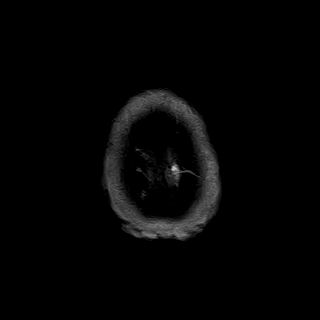

[Series 13: T2 · coronal · 6.0mm · 0.43mm/px · 4 of 24 slices shown (2 of 2)]
[im 1/24]
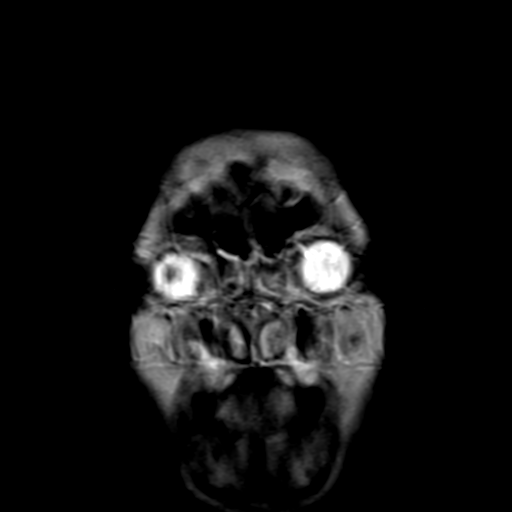
[im 8/24]
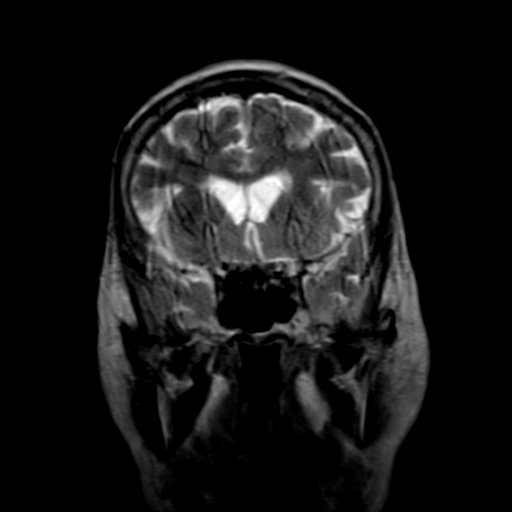
[im 16/24]
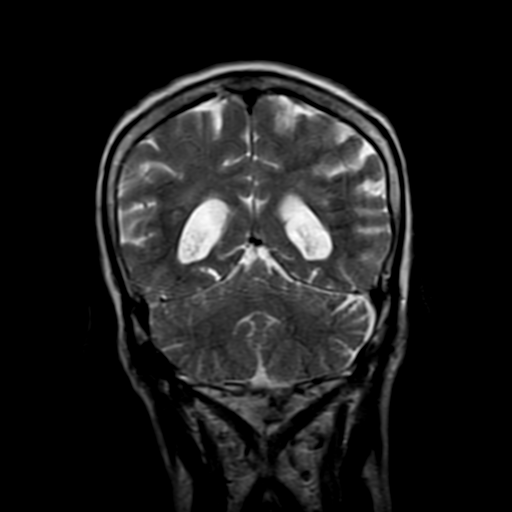
[im 24/24]
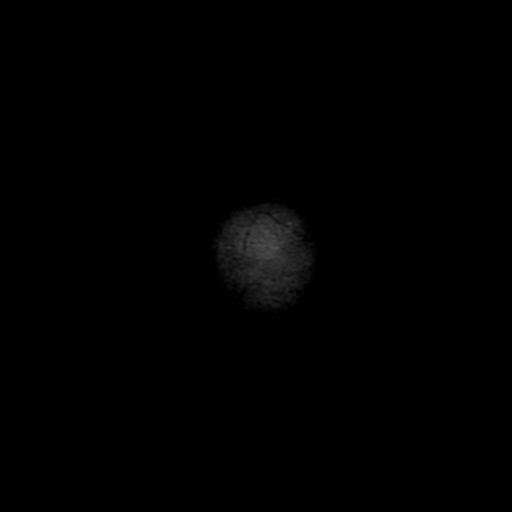

[48 of 48 positions shown; findings below may reference images not displayed]

FINDINGS: No focal areas of restricted diffusion. No intracranial bleed, extra-axial collection, ventriculomegaly or midline shift are seen.

Age-appropriate moderate, symmetric global cerebral atrophy. Major arteries of circle of Willis and dural venous sinuses are patent.  Moderate chronic small-vessel ischemic change of periventricular white matter on the FLAIR images.  No focal lesions of the posterior fossa.

Sinusitis involving ethmoid and maxillary sinuses are noted with retention cyst in the right maxillary sinus.
IMPRESSION: 1. No acute ischemia, demyelinating lesions or space-occupying lesions are seen.  No evidence of ventriculomegaly.

2. Age-appropriate, symmetric global cerebral cortical atrophy. Moderate chronic small-vessel ischemic change of periventricular white matter on both sides. Major arteries of circle of Willis and dural venous sinuses are patent.

3. Sinusitis involving ethmoid and maxillary sinuses.

## 2022-08-16 IMAGING — MR MRI CERVICAL SPINE WITHOUT CONTRAST
4 of 5 series · 23 of 48 positions shown · IV contrast (gadolinium)
Comparison: None available.

﻿EXAM:  [id number]   MRI CERVICAL SPINE WITHOUT CONTRAST
INDICATION: Neck pain, bilateral shoulder pain.  Cervical myelopathy.
TECHNIQUE: Multiplanar, multisequential MRI of the C-spine was performed without gadolinium contrast.

[Series 5: T2 · sagittal · 3.0mm · 0.75mm/px · 8 of 13 slices shown (1 of 2)]
[im 1/13]
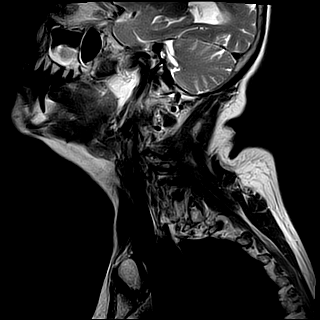
[im 2/13]
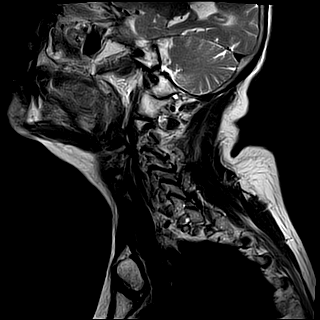
[im 4/13]
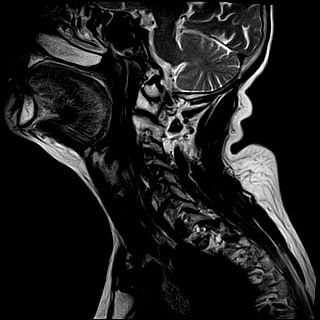
[im 6/13]
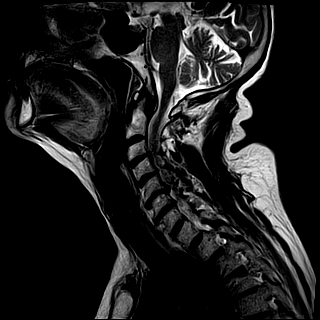
[im 7/13]
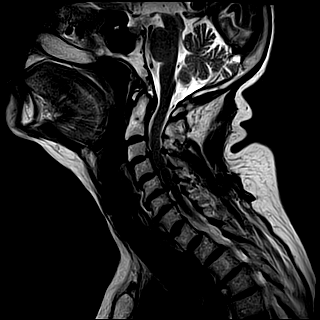
[im 9/13]
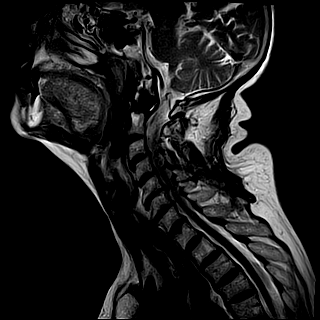
[im 11/13]
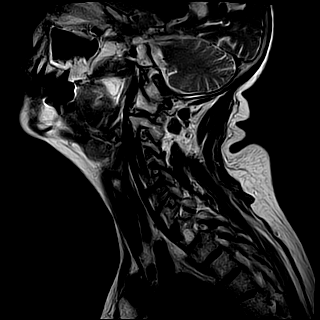
[im 13/13]
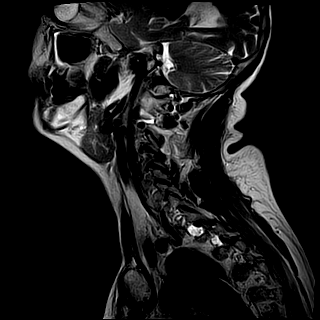

[Series 6: T1 · sagittal · 3.0mm · 0.47mm/px · 3 of 13 slices shown]
[im 2/13]
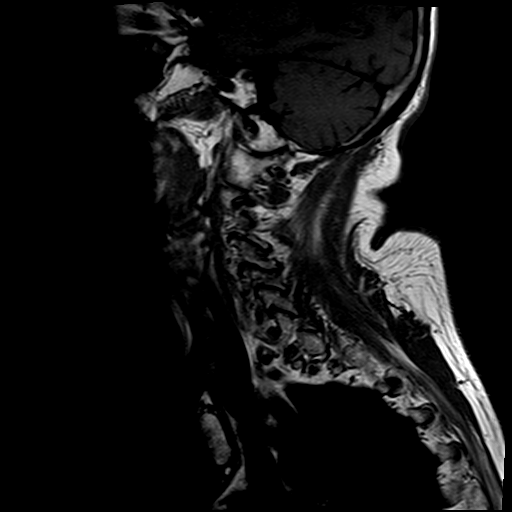
[im 7/13]
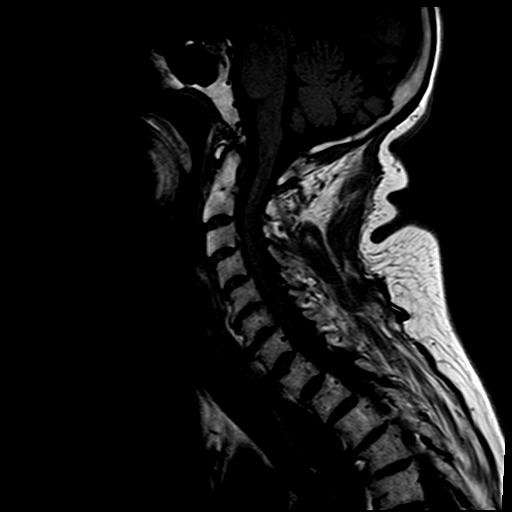
[im 11/13]
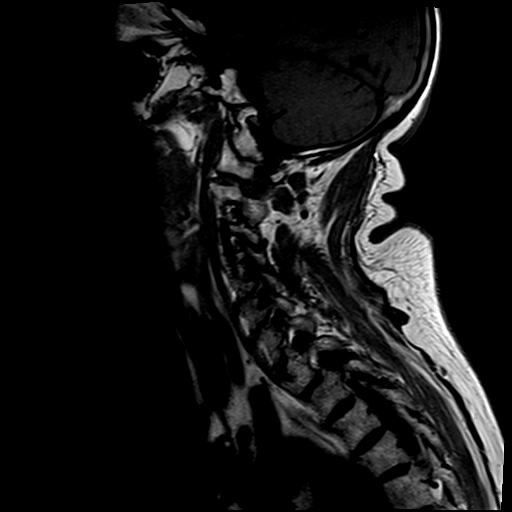

[Series 7: STIR · sagittal · 3.0mm · 0.47mm/px · 3 of 13 slices shown]
[im 2/13]
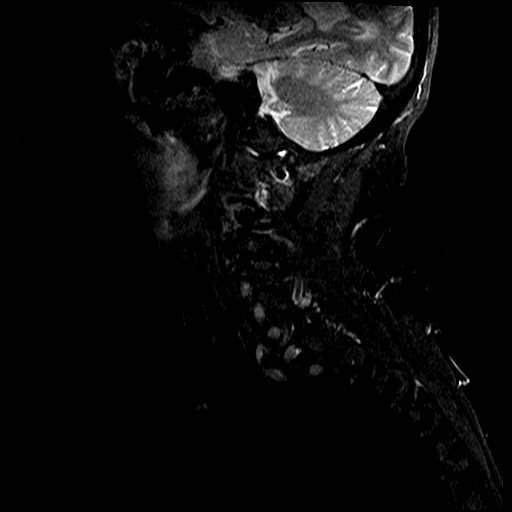
[im 7/13]
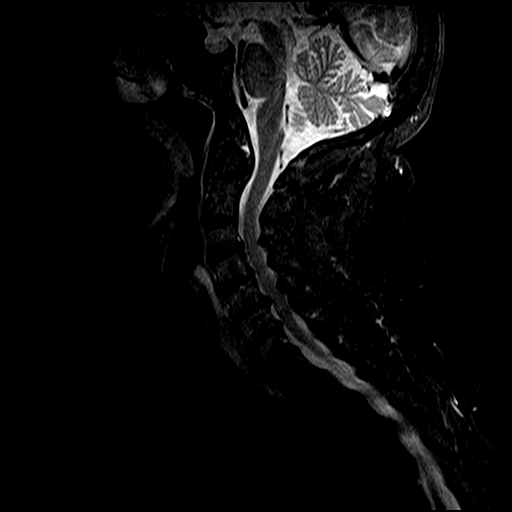
[im 11/13]
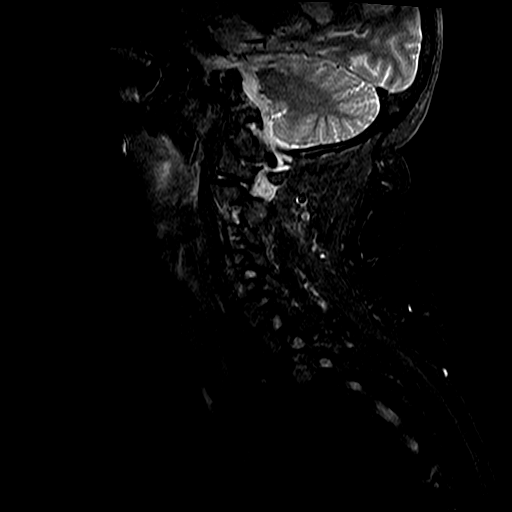

[Series 8: T2 · oblique · 3.0mm · 0.39mm/px · 9 of 18 slices shown (2 of 2)]
[im 1/18]
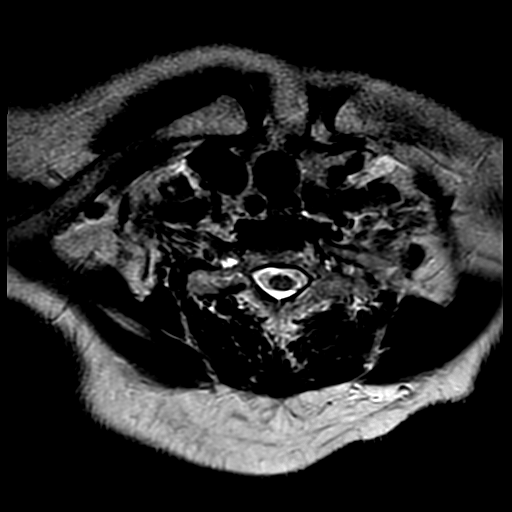
[im 4/18]
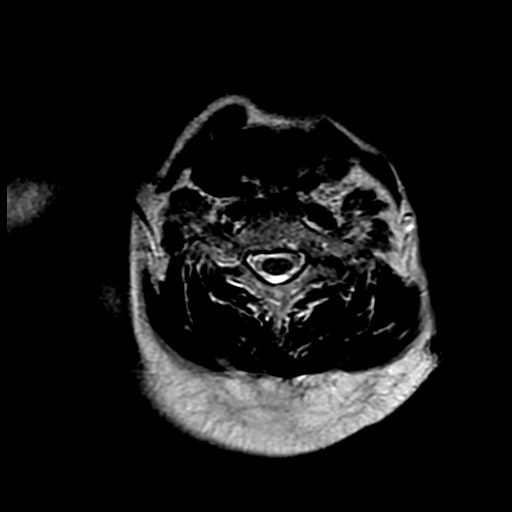
[im 5/18]
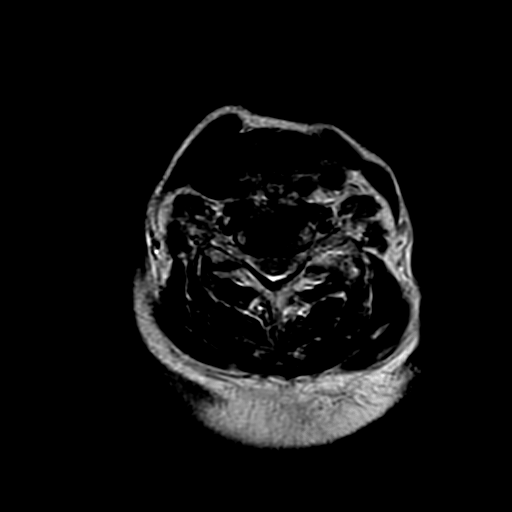
[im 8/18]
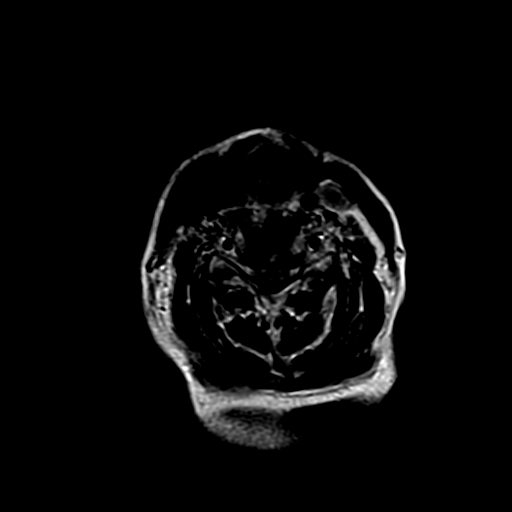
[im 10/18]
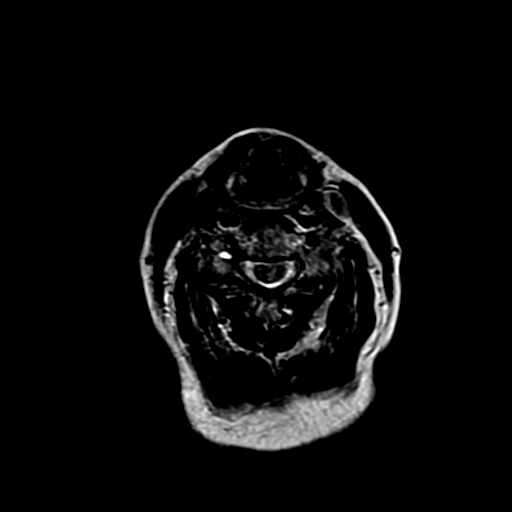
[im 13/18]
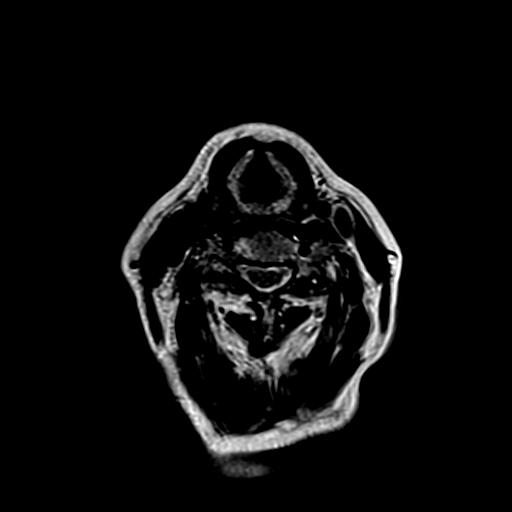
[im 14/18]
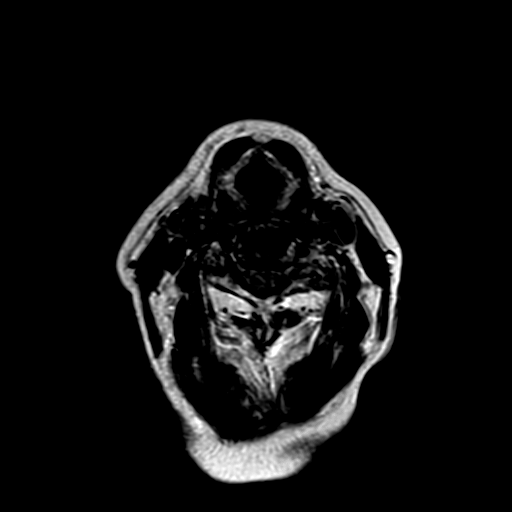
[im 16/18]
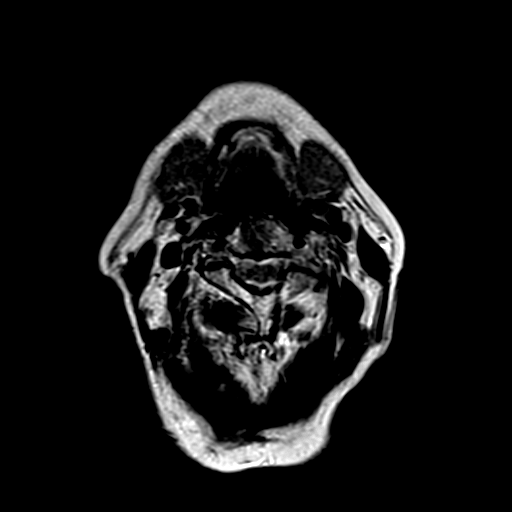
[im 18/18]
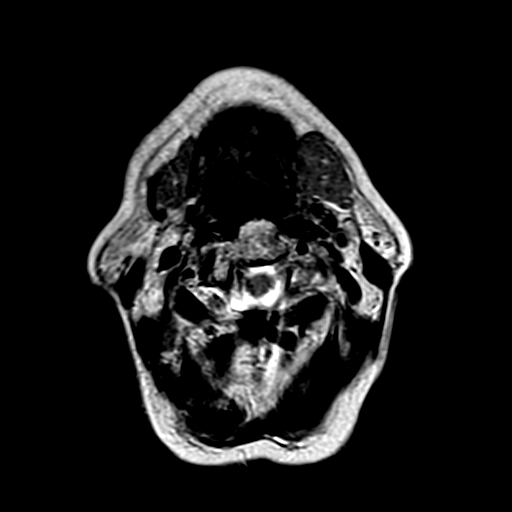

[23 of 48 positions shown; findings below may reference images not displayed]

FINDINGS: No acute bony lesions of cervical vertebrae.  Structures at the foramen magnum are normal. 

At C2-3 level, no focal disc lesions are seen. 

At C3-4 level, degenerative disc disease with dorsal ligament hypertrophy are causing mild to moderate compromise of thecal sac and both lateral recesses and neural foramina. AP diameter of thecal sac in the midline measures 8.1 mm.

At C4-5 level, significant degenerative disc disease with asymmetric bulging annulus and osteophyte complex are causing severe right foraminal narrowing. Moderately significant compromise of thecal sac in the midline with AP diameter measuring 8.5 mm.

At C5-6 level, severe degenerative disc changes are noted with disc osteophyte complex and hypertrophy of dorsal ligaments causing significant compromise of thecal sac with AP diameter in the midline measuring 6.9 mm. Significant compromise of both lateral recesses and neural foramina are noted left more than the right.

At C6-7 level, degenerative disc disease with bulging annulus is causing mild compromise of thecal sac and moderate compromise of both neural foramina. AP diameter of thecal sac in the midline measures 10 mm.

Cervical spinal cord shows no evidence of cord edema or syrinx cavity. Extrinsic pressure on the cervical cord predominantly at C5-6 level due to the degenerative changes described above.

Paravertebral soft tissues are unremarkable.
IMPRESSION: 1.  No acute bone changes of cervical vertebrae. 

2.  At C5-6 level, severe degenerative disc changes are noted with disc osteophyte complex and hypertrophy of dorsal ligaments causing significant compromise of thecal sac with AP diameter in the midline measuring 6.9 mm. Significant compromise of both lateral recesses and neural foramina are noted left more than the right.

3. Findings at other disc levels are described above in detail. 

4. Cervical spinal cord shows no evidence of cord edema or syrinx cavity. Extrinsic compression by the degenerative changes predominantly at C5-6 level on the cervical cord are noted.

## 2022-10-31 ENCOUNTER — Other Ambulatory Visit: Payer: Self-pay

## 2022-10-31 ENCOUNTER — Ambulatory Visit (INDEPENDENT_AMBULATORY_CARE_PROVIDER_SITE_OTHER): Payer: Medicare HMO | Admitting: OTOLARYNGOLOGY

## 2022-10-31 ENCOUNTER — Encounter (INDEPENDENT_AMBULATORY_CARE_PROVIDER_SITE_OTHER): Payer: Self-pay | Admitting: OTOLARYNGOLOGY

## 2022-10-31 VITALS — Ht 62.0 in | Wt 137.0 lb

## 2022-10-31 DIAGNOSIS — J343 Hypertrophy of nasal turbinates: Secondary | ICD-10-CM

## 2022-10-31 DIAGNOSIS — J449 Chronic obstructive pulmonary disease, unspecified: Secondary | ICD-10-CM

## 2022-10-31 DIAGNOSIS — F325 Major depressive disorder, single episode, in full remission: Secondary | ICD-10-CM | POA: Insufficient documentation

## 2022-10-31 DIAGNOSIS — N183 Chronic kidney disease, stage 3 unspecified (CMS HCC): Secondary | ICD-10-CM | POA: Insufficient documentation

## 2022-10-31 DIAGNOSIS — M353 Polymyalgia rheumatica: Secondary | ICD-10-CM | POA: Insufficient documentation

## 2022-10-31 DIAGNOSIS — K219 Gastro-esophageal reflux disease without esophagitis: Secondary | ICD-10-CM

## 2022-10-31 DIAGNOSIS — J329 Chronic sinusitis, unspecified: Secondary | ICD-10-CM

## 2022-10-31 DIAGNOSIS — H6993 Unspecified Eustachian tube disorder, bilateral: Secondary | ICD-10-CM

## 2022-10-31 DIAGNOSIS — I471 Supraventricular tachycardia, unspecified (CMS HCC): Secondary | ICD-10-CM | POA: Insufficient documentation

## 2022-10-31 DIAGNOSIS — J309 Allergic rhinitis, unspecified: Secondary | ICD-10-CM

## 2022-10-31 DIAGNOSIS — J341 Cyst and mucocele of nose and nasal sinus: Secondary | ICD-10-CM

## 2022-10-31 MED ORDER — MONTELUKAST 10 MG TABLET
10.0000 mg | ORAL_TABLET | Freq: Every day | ORAL | 3 refills | Status: DC
Start: 2022-10-31 — End: 2023-09-24

## 2022-10-31 MED ORDER — CETIRIZINE 10 MG TABLET
10.0000 mg | ORAL_TABLET | Freq: Every evening | ORAL | 3 refills | Status: DC
Start: 2022-10-31 — End: 2023-09-24

## 2022-10-31 NOTE — H&P (Signed)
[facility], [facility]  [address]  [place] [address]        Name: Karen Chase MRN:  [id number]   Date: 10/31/2022 DOB: 1940-08-10 (82 y.o.)       Referring Provider:  Onnie Boer, MD    Reason for Visit:   Chief Complaint   Patient presents with    Allergic Rhinitis     Per pt was sick about 6 months in the winter and still having allergies symptoms like itching around her nose and ears, states she has tried multiple OTC medications        History of Present Illness:  Karen Chase is a 82 y.o. female who is referred for eval of allergies.  Pt states she has suffered from allergy and sinus problems her whole life. Winter is usually the worst season. She c/o maxillary pressure, nasal congestion, drainage and hoarseness. Takes Claritin and benadryl. Different parts of her body get itchy. Neck CT showed a right maxillary retention cyst.  History of reflux on Tagamet Protonix      Patient History:  Patient Active Problem List   Diagnosis    Anemia    Arthritis of right acromioclavicular joint    Chronic kidney disease, stage 3 unspecified (CMS HCC)    Chronic obstructive pulmonary disease, unspecified (CMS HCC)    Essential hypertension    Impingement syndrome of left shoulder region    Low back pain    Lung nodule    Major depressive disorder, single episode, in full remission (CMS HCC)    Mixed hyperlipidemia    Pain in joint    Pain of both shoulder joints    Pain of left hip joint    Pain of left sacroiliac joint    Piriformis syndrome of left side    Polymyalgia rheumatica (CMS HCC)    Rheumatoid arthritis (CMS HCC)    Strain of supraspinatus muscle or tendon    Subacromial bursitis of right shoulder joint    Supraventricular tachycardia (CMS HCC)    Syncope and collapse    Type 2 diabetes mellitus (CMS HCC)    Urinary incontinence    Vaginal irritation    Vitamin D deficiency    Vaginal pain     Current Outpatient Medications   Medication Sig    alendronate (FOSAMAX) 70 mg Oral Tablet      B complex-vitamin C-folic acid (NEPHRO-VITE) 0.8 mg Oral Tablet Take 1 Tablet by mouth Once a day    cetirizine (ZYRTEC) 10 mg Oral Tablet Take 1 Tablet (10 mg total) by mouth Every evening    Cimetidine (TAGAMET HB) 200 mg Oral Tablet Take 1 tablet twice a day by oral route.    dilTIAZem (CARDIZEM CD) 240 mg Oral Capsule, Sust. Release 24 hr Take 1 Capsule (240 mg total) by mouth Once a day    donepeziL (ARICEPT) 10 mg Oral Tablet Take 1 Tablet (10 mg total) by mouth Once a day    ergocalciferol, vitamin D2, (DRISDOL) 1,250 mcg (50,000 unit) Oral Capsule Take 1 Capsule (50,000 Units total) by mouth Every 7 days    escitalopram oxalate (LEXAPRO) 10 mg Oral Tablet     fluticasone propionate (FLONASE) 50 mcg/actuation Nasal Spray, Suspension     GEMTESA 75 mg Oral Tablet     loratadine (CLARITIN) 10 mg Oral Tablet Take 1 Tablet (10 mg total) by mouth Once a day    LORazepam (ATIVAN) 1 mg Oral Tablet Take 1 Tablet (1 mg  total) by mouth Three times a day    losartan (COZAAR) 25 mg Oral Tablet Take 1 tablet every day by oral route for 30 days.    losartan-hydrochlorothiazide (HYZAAR) 100-25 mg Oral Tablet Take 1 Tablet by mouth Once a day    metFORMIN (GLUCOPHAGE) 500 mg Oral Tablet Take 1 Tablet (500 mg total) by mouth Once a day    metoprolol succinate (TOPROL-XL) 25 mg Oral Tablet Sustained Release 24 hr     metoprolol tartrate (LOPRESSOR) 25 mg Oral Tablet Take 1 Tablet (25 mg total) by mouth Twice daily    montelukast (SINGULAIR) 10 mg Oral Tablet Take 1 Tablet (10 mg total) by mouth Once a day    pantoprazole (PROTONIX) 40 mg Oral Tablet, Delayed Release (E.C.) Take 1 Tablet (40 mg total) by mouth Once a day    pravastatin (PRAVACHOL) 80 mg Oral Tablet     sertraline (ZOLOFT) 100 mg Oral Tablet Take 1 Tablet (100 mg total) by mouth Once a day    traMADoL (ULTRAM) 50 mg Oral Tablet Take 1 Tablet (50 mg total) by mouth Every 6 hours as needed for Pain    vitamin D3-vitamin K2 1,250-200 mcg Oral Capsule Take 1 Capsule  by mouth      Allergies   Allergen Reactions    Sulfa (Sulfonamides) Hives/ Urticaria     Past Medical History:   Diagnosis Date    Chronic obstructive pulmonary disease, unspecified (CMS HCC) 10/31/2022    Esophageal reflux     controlled with med    History of tuberculosis     as teenager    Hyperlipidemia     Hypertension     Type 2 diabetes mellitus (CMS HCC)      Past Surgical History:   Procedure Laterality Date    COLONOSCOPY      HAND SURGERY Right     HX HYSTERECTOMY      HX LAP CHOLECYSTECTOMY       Family Medical History:    None         Social History     Tobacco Use    Smoking status: Never    Smokeless tobacco: Never   Substance Use Topics    Alcohol use: Not Currently    Drug use: Not Currently       Review of Systems:  Review of Systems    Physical Exam:  Ht 1.575 m (5\' 2" )   Wt 62.1 kg (137 lb)   BMI 25.06 kg/m       Physical Exam  Constitutional:       Appearance: Normal appearance. She is well-developed, well-groomed and normal weight.   HENT:      Head: Normocephalic and atraumatic.      Right Ear: Hearing, tympanic membrane, ear canal and external ear normal.      Left Ear: Hearing, tympanic membrane, ear canal and external ear normal.      Nose: Septal deviation and mucosal edema present.      Right Turbinates: Enlarged.      Left Turbinates: Enlarged.      Mouth/Throat:      Lips: Pink.      Mouth: Mucous membranes are moist.      Pharynx: Oropharynx is clear. Uvula midline.   Eyes:      Extraocular Movements: Extraocular movements intact.   Neck:      Trachea: Phonation normal.   Pulmonary:      Effort: Pulmonary effort is normal.  Musculoskeletal:      Cervical back: Normal range of motion and neck supple.   Lymphadenopathy:      Cervical: No cervical adenopathy.   Skin:     General: Skin is warm.   Neurological:      Mental Status: She is alert and oriented to person, place, and time.      Cranial Nerves: Cranial nerves 2-12 are intact. No facial asymmetry.   Psychiatric:          Attention and Perception: Attention normal.         Mood and Affect: Mood normal.         Speech: Speech normal.         Behavior: Behavior normal. Behavior is cooperative.          Assessment:  ENCOUNTER DIAGNOSES     ICD-10-CM   1. Chronic allergic rhinitis  J30.9   2. Mucous retention cyst of maxillary sinus  J34.1   3. 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 ETD (Eustachian tube dysfunction), bilateral  H69.93   6. Gastroesophageal reflux disease, unspecified whether esophagitis present  K21.9       Plan:  Referral notes and Medical records reviewed on 10/31/2022.  Rx Zyrtec q.h.s.  Rx Singulair daily  Continue Flonase daily  Nasal saline b.i.d.  Diet modifications/reflux precautions  Continue Tagamet and Protonix daily  Orders Placed This Encounter    47829 - NASAL ENDOSCOPY DIAGNOSTIC UNILATERAL OR BILATERAL (AMB ONLY)    montelukast (SINGULAIR) 10 mg Oral Tablet    cetirizine (ZYRTEC) 10 mg Oral Tablet     Follow-up in 3-4 months or sooner PRN      The advanced practice clinician's documentation was reviewed/amended in its entirety with the assessment and plan portion completely performed independently by me during this encounter.   Lonia Farber, D.O., MMS  ENT / Facial Plastic Surgery      Marcelline Deist, PA-C    I appreciate the opportunity to be involved in the care of your patients.  If you have any questions or concerns regarding this encounter, please do not hesitate to contact me at your convenience.        This note may have been partially generated using MModal Fluency Direct system, and there may be some incorrect words, spellings, and punctuation that were not noted in checking the note before saving, though effort was made to avoid such errors.

## 2022-10-31 NOTE — Procedures (Signed)
[facility], [facility]  [address]  [place] [address]    Procedure Note    Name: Karen Chase MRN:  [id number]   Date: 10/31/2022 DOB:  13-Jul-1940 (82 y.o.)         31231 - NASAL ENDOSCOPY DIAGNOSTIC UNILATERAL OR BILATERAL (AMB ONLY)    Performed by: Lonia Farber, DO  Authorized by: Lonia Farber, DO    Time Out:     Immediately before the procedure, a time out was called:  Yes    Patient verified:  Yes    Procedure Verified:  Yes    Site Verified:  Yes  Indications for procedure: Monitor chronic disease    Anesthesia:  Topical lidocaine/phenylephrine    Description: Nasal endoscopy with a flexible rhinolaryngoscope was performed with examination of the  septum, inferior, middle, and superior meatus, turbinates, sphenoethmoidal recess, and nasopharynx. ET orifices and nasopharynx were normal.     Findings: Septal deviation, Inferior turbinate hypertrophy, and Allergic rhinitis    The patient tolerated the procedure well.      This note may have been partially generated using MModal Fluency Direct system, and there may be some incorrect words, spellings, and punctuation that were not noted in checking the note before saving, though effort was made to avoid such errors.      Lonia Farber, DO

## 2023-03-14 ENCOUNTER — Ambulatory Visit (INDEPENDENT_AMBULATORY_CARE_PROVIDER_SITE_OTHER): Payer: Self-pay | Admitting: OTOLARYNGOLOGY

## 2023-09-18 ENCOUNTER — Emergency Department (HOSPITAL_COMMUNITY)

## 2023-09-18 ENCOUNTER — Other Ambulatory Visit: Payer: Self-pay

## 2023-09-18 ENCOUNTER — Encounter (HOSPITAL_COMMUNITY): Payer: Self-pay

## 2023-09-18 ENCOUNTER — Inpatient Hospital Stay
Admission: EM | Admit: 2023-09-18 | Discharge: 2023-09-24 | DRG: 522 | Disposition: A | Discharge status: Skilled Nursing Facility

## 2023-09-18 ENCOUNTER — Inpatient Hospital Stay (HOSPITAL_COMMUNITY)

## 2023-09-18 DIAGNOSIS — K5900 Constipation, unspecified: Secondary | ICD-10-CM | POA: Diagnosis not present

## 2023-09-18 DIAGNOSIS — J449 Chronic obstructive pulmonary disease, unspecified: Secondary | ICD-10-CM | POA: Diagnosis present

## 2023-09-18 DIAGNOSIS — Z7984 Long term (current) use of oral hypoglycemic drugs: Secondary | ICD-10-CM

## 2023-09-18 DIAGNOSIS — S72001A Fracture of unspecified part of neck of right femur, initial encounter for closed fracture: Secondary | ICD-10-CM | POA: Diagnosis present

## 2023-09-18 DIAGNOSIS — F32A Depression, unspecified: Secondary | ICD-10-CM | POA: Diagnosis present

## 2023-09-18 DIAGNOSIS — K219 Gastro-esophageal reflux disease without esophagitis: Secondary | ICD-10-CM | POA: Diagnosis present

## 2023-09-18 DIAGNOSIS — E119 Type 2 diabetes mellitus without complications: Secondary | ICD-10-CM | POA: Diagnosis present

## 2023-09-18 DIAGNOSIS — E1122 Type 2 diabetes mellitus with diabetic chronic kidney disease: Secondary | ICD-10-CM | POA: Diagnosis present

## 2023-09-18 DIAGNOSIS — W010XXA Fall on same level from slipping, tripping and stumbling without subsequent striking against object, initial encounter: Secondary | ICD-10-CM | POA: Diagnosis present

## 2023-09-18 DIAGNOSIS — Z96641 Presence of right artificial hip joint: Secondary | ICD-10-CM

## 2023-09-18 DIAGNOSIS — Z01818 Encounter for other preprocedural examination: Secondary | ICD-10-CM

## 2023-09-18 DIAGNOSIS — W109XXA Fall (on) (from) unspecified stairs and steps, initial encounter: Secondary | ICD-10-CM

## 2023-09-18 DIAGNOSIS — N183 Chronic kidney disease, stage 3 unspecified: Secondary | ICD-10-CM | POA: Diagnosis present

## 2023-09-18 DIAGNOSIS — I129 Hypertensive chronic kidney disease with stage 1 through stage 4 chronic kidney disease, or unspecified chronic kidney disease: Secondary | ICD-10-CM | POA: Diagnosis present

## 2023-09-18 DIAGNOSIS — S72009A Fracture of unspecified part of neck of unspecified femur, initial encounter for closed fracture: Principal | ICD-10-CM | POA: Diagnosis present

## 2023-09-18 DIAGNOSIS — S72011A Unspecified intracapsular fracture of right femur, initial encounter for closed fracture: Principal | ICD-10-CM | POA: Diagnosis present

## 2023-09-18 DIAGNOSIS — I10 Essential (primary) hypertension: Secondary | ICD-10-CM | POA: Diagnosis present

## 2023-09-18 DIAGNOSIS — F419 Anxiety disorder, unspecified: Secondary | ICD-10-CM | POA: Diagnosis present

## 2023-09-18 DIAGNOSIS — Z79899 Other long term (current) drug therapy: Secondary | ICD-10-CM

## 2023-09-18 DIAGNOSIS — E782 Mixed hyperlipidemia: Secondary | ICD-10-CM | POA: Diagnosis present

## 2023-09-18 DIAGNOSIS — I2510 Atherosclerotic heart disease of native coronary artery without angina pectoris: Secondary | ICD-10-CM | POA: Diagnosis present

## 2023-09-18 DIAGNOSIS — D509 Iron deficiency anemia, unspecified: Secondary | ICD-10-CM | POA: Diagnosis present

## 2023-09-18 LAB — COMPREHENSIVE METABOLIC PANEL, NON-FASTING
ALBUMIN/GLOBULIN RATIO: 1.4 (ref 0.8–1.4)
ALBUMIN: 4.4 g/dL (ref 3.5–5.7)
ALKALINE PHOSPHATASE: 58 U/L (ref 34–104)
ALT (SGPT): 23 U/L (ref 7–52)
ANION GAP: 9 mmol/L (ref 4–13)
AST (SGOT): 25 U/L (ref 13–39)
BILIRUBIN TOTAL: 0.6 mg/dL (ref 0.3–1.0)
BUN/CREA RATIO: 27 — ABNORMAL HIGH (ref 6–22)
BUN: 28 mg/dL — ABNORMAL HIGH (ref 7–25)
CALCIUM, CORRECTED: 9.8 mg/dL (ref 8.9–10.8)
CALCIUM: 10.1 mg/dL (ref 8.6–10.3)
CHLORIDE: 108 mmol/L — ABNORMAL HIGH (ref 98–107)
CO2 TOTAL: 24 mmol/L (ref 21–31)
CREATININE: 1.02 mg/dL (ref 0.60–1.30)
ESTIMATED GFR: 55 mL/min/1.73mˆ2 — ABNORMAL LOW (ref >59)
GLOBULIN: 3.1 (ref 2.0–3.5)
GLUCOSE: 158 mg/dL — ABNORMAL HIGH (ref 74–109)
OSMOLALITY, CALCULATED: 290 mosm/kg (ref 270–290)
POTASSIUM: 4.1 mmol/L (ref 3.5–5.1)
PROTEIN TOTAL: 7.5 g/dL (ref 6.4–8.9)
SODIUM: 141 mmol/L (ref 136–145)

## 2023-09-18 LAB — TYPE AND SCREEN
ABO/RH(D): A NEG
ANTIBODY SCREEN: NEGATIVE

## 2023-09-18 LAB — CBC WITH DIFF
BASOPHIL #: 0 x10ˆ3/uL (ref 0.00–0.10)
BASOPHIL %: 0 % (ref 0–1)
EOSINOPHIL #: 0.1 x10ˆ3/uL (ref 0.00–0.50)
EOSINOPHIL %: 2 % (ref 1–7)
HCT: 38.3 % (ref 31.2–41.9)
HGB: 13.5 g/dL (ref 10.9–14.3)
LYMPHOCYTE #: 1 x10ˆ3/uL — ABNORMAL LOW (ref 1.10–3.10)
LYMPHOCYTE %: 12 % — ABNORMAL LOW (ref 16–46)
MCH: 31.2 pg (ref 24.7–32.8)
MCHC: 35.4 g/dL (ref 32.3–35.6)
MCV: 88.3 fL (ref 75.5–95.3)
MONOCYTE #: 0.4 x10ˆ3/uL (ref 0.20–0.90)
MONOCYTE %: 6 % (ref 4–11)
MPV: 7.2 fL — ABNORMAL LOW (ref 7.9–10.8)
NEUTROPHIL #: 6.5 x10ˆ3/uL (ref 1.90–8.20)
NEUTROPHIL %: 80 % — ABNORMAL HIGH (ref 43–77)
PLATELETS: 136 x10ˆ3/uL — ABNORMAL LOW (ref 140–440)
RBC: 4.34 x10ˆ6/uL (ref 3.63–4.92)
RDW: 12.8 % (ref 12.3–17.7)
WBC: 8 x10ˆ3/uL (ref 3.8–11.8)

## 2023-09-18 LAB — ECG 12 LEAD
Atrial Rate: 79 {beats}/min
Calculated P Axis: 73 degrees
Calculated R Axis: 55 degrees
Calculated T Axis: 24 degrees
PR Interval: 198 ms
QRS Duration: 84 ms
QT Interval: 412 ms
QTC Calculation: 472 ms
Ventricular rate: 79 {beats}/min

## 2023-09-18 LAB — PT/INR
INR: 1.04 (ref 0.84–1.10)
PROTHROMBIN TIME: 11.7 s (ref 9.8–12.7)

## 2023-09-18 LAB — PTT (PARTIAL THROMBOPLASTIN TIME): APTT: 29.2 s (ref 25.0–38.0)

## 2023-09-18 MED ORDER — ONDANSETRON HCL (PF) 4 MG/2 ML INJECTION SOLUTION
4.0000 mg | INTRAMUSCULAR | Status: AC
Start: 2023-09-18 — End: 2023-09-18
  Administered 2023-09-18: 4 mg via INTRAVENOUS

## 2023-09-18 MED ORDER — ONDANSETRON HCL (PF) 4 MG/2 ML INJECTION SOLUTION
INTRAMUSCULAR | Status: AC
Start: 2023-09-18 — End: 2023-09-18
  Filled 2023-09-18: qty 2

## 2023-09-18 MED ORDER — MORPHINE 4 MG/ML INJECTION WRAPPER
INJECTION | INTRAMUSCULAR | Status: AC
Start: 2023-09-18 — End: 2023-09-18
  Filled 2023-09-18: qty 1

## 2023-09-18 MED ORDER — MORPHINE 4 MG/ML INJECTION WRAPPER
4.0000 mg | INJECTION | INTRAMUSCULAR | Status: AC
Start: 2023-09-18 — End: 2023-09-18
  Administered 2023-09-18: 4 mg via INTRAVENOUS

## 2023-09-18 NOTE — ED Provider Notes (Signed)
 [facility] - Emergency Department  ED Primary Note  History of Present Illness   Karen Chase is a 83 y.o. female who had concerns including Hip Pain and Fall.     Patient is an 83 year old female past medical history of hypertension anxiety presents emergency department with complaints of right hip pain.  States he was outside working in her garden and tripped over a step landed on her right hip now has pain.  Worse with movement.  Denies loss of consciousness head injury or anticoagulation.    Physical Exam   ED Triage Vitals [09/18/23 2127]   BP (Non-Invasive) (!) 170/81   Heart Rate 78   Respiratory Rate 18   Temperature 36.2 C (97.2 F)   SpO2 98 %   Weight    Height      Physical Exam  Constitutional:       Appearance: Normal appearance.   HENT:      Head: Normocephalic.      Nose: Nose normal.      Mouth/Throat:      Mouth: Mucous membranes are moist.   Eyes:      Extraocular Movements: Extraocular movements intact.      Pupils: Pupils are equal, round, and reactive to light.   Cardiovascular:      Rate and Rhythm: Normal rate and regular rhythm.      Pulses: Normal pulses.      Heart sounds: Normal heart sounds.   Pulmonary:      Effort: Pulmonary effort is normal.      Breath sounds: Normal breath sounds.   Abdominal:      General: Abdomen is flat.      Palpations: Abdomen is soft.   Musculoskeletal:      Cervical back: Normal range of motion.      Comments: Right leg short and externally rotated tender to palpation of proximal femur neurovascularly intact distally   Neurological:      Mental Status: She is alert.       Patient Data   Labs Ordered/Reviewed   COMPREHENSIVE METABOLIC PANEL, NON-FASTING - Abnormal; Notable for the following components:       Result Value    CHLORIDE 108 (*)     BUN 28 (*)     BUN/CREA RATIO 27 (*)     ESTIMATED GFR 55 (*)     GLUCOSE 158 (*)     All other components within normal limits    Narrative:     Estimated Glomerular Filtration Rate (eGFR) is  calculated using the CKD-EPI (2021) equation, intended for patients 78 years of age and older. If gender is not documented or unknown, there will be no eGFR calculation.     CBC WITH DIFF - Abnormal; Notable for the following components:    PLATELETS 136 (*)     MPV 7.2 (*)     NEUTROPHIL % 80 (*)     LYMPHOCYTE % 12 (*)     LYMPHOCYTE # 1.00 (*)     All other components within normal limits   PT/INR - Normal    Narrative:     In the setting of warfarin therapy, a moderate-intensity INR goal range is 2.0 to 3.0 and a high-intensity INR goal range is 2.5 to 3.5.    INR is ONLY validated to determine the level of anticoagulation with vitamin K antagonists (warfarin). Other factors may elevate the INR including but not limited to direct oral anticoagulants (DOACs), liver dysfunction,  vitamin K deficiency, DIC, factor deficiencies, and factor inhibitors.   PTT (PARTIAL THROMBOPLASTIN TIME) - Normal   CBC/DIFF    Narrative:     The following orders were created for panel order CBC/DIFF.  Procedure                               Abnormality         Status                     ---------                               -----------         ------                     CBC WITH IPQQ[[id number]]                Abnormal            Final result                 Please view results for these tests on the individual orders.   EXTRA TUBES    Narrative:     The following orders were created for panel order EXTRA TUBES.  Procedure                               Abnormality         Status                     ---------                               -----------         ------                     GOLD TOP ULAZ[[id number]]                                    In process                 GRAY TOP ULAZ[[id number]]                                    In process                   Please view results for these tests on the individual orders.   GOLD TOP TUBE   GRAY TOP TUBE   TYPE AND SCREEN     XR HIP RIGHT W PELVIS 2-3 VIEWS   Final Result by Edi,  [facility]results In (07/22 2242)   Acute displaced right femoral neck fracture                      Radiologist location ID: [id number]         XR AP MOBILE CHEST   Final Result by Edi, [facility]results In (07/22 2243)   NEGATIVE CHEST            Radiologist location ID: [id number]  Medical Decision Making        Medical Decision Making  Plain films of the right hip demonstrate femoral neck fracture with some displacement.  No other injuries identified.  Preop workup ordered.  Discussed case with the orthopedist and hospital team.  Hospital team agreed to evaluate patient for admission    Amount and/or Complexity of Data Reviewed  Labs: ordered.  Radiology: ordered.  ECG/medicine tests: ordered.    Risk  Prescription drug management.  Parenteral controlled substances.  Decision regarding hospitalization.                Medications Ordered/Administered in the ED   ondansetron  (ZOFRAN ) 2 mg/mL injection (has no administration in time range)   morphine  4 mg/mL injection (has no administration in time range)     Clinical Impression   Hip fracture (Primary)       Disposition: Admitted

## 2023-09-18 NOTE — ED Triage Notes (Signed)
 To ED via [facility]. Trying rose bush and tripped landed on R hip. Denies LOC or hitting head. No blood thinners.    20G LAC  4 mg Zofran   50 mg Fent.SABRAx2

## 2023-09-19 ENCOUNTER — Inpatient Hospital Stay (HOSPITAL_COMMUNITY)

## 2023-09-19 ENCOUNTER — Encounter (HOSPITAL_COMMUNITY): Payer: Self-pay

## 2023-09-19 ENCOUNTER — Inpatient Hospital Stay (HOSPITAL_COMMUNITY): Admitting: Anesthesiology

## 2023-09-19 ENCOUNTER — Encounter (HOSPITAL_COMMUNITY)
Admission: EM | Disposition: A | Discharge status: Skilled Nursing Facility | Payer: Self-pay | Source: Home / Self Care | Attending: Internal Medicine

## 2023-09-19 DIAGNOSIS — W010XXA Fall on same level from slipping, tripping and stumbling without subsequent striking against object, initial encounter: Secondary | ICD-10-CM

## 2023-09-19 DIAGNOSIS — Z96641 Presence of right artificial hip joint: Secondary | ICD-10-CM

## 2023-09-19 DIAGNOSIS — F32A Depression, unspecified: Secondary | ICD-10-CM | POA: Diagnosis present

## 2023-09-19 DIAGNOSIS — Z471 Aftercare following joint replacement surgery: Secondary | ICD-10-CM

## 2023-09-19 LAB — COMPREHENSIVE METABOLIC PANEL, NON-FASTING
ALBUMIN/GLOBULIN RATIO: 1.5 — ABNORMAL HIGH (ref 0.8–1.4)
ALBUMIN: 4.5 g/dL (ref 3.5–5.7)
ALKALINE PHOSPHATASE: 63 U/L (ref 34–104)
ALT (SGPT): 25 U/L (ref 7–52)
ANION GAP: 8 mmol/L (ref 4–13)
AST (SGOT): 26 U/L (ref 13–39)
BILIRUBIN TOTAL: 0.8 mg/dL (ref 0.3–1.0)
BUN/CREA RATIO: 26 — ABNORMAL HIGH (ref 6–22)
BUN: 23 mg/dL (ref 7–25)
CALCIUM, CORRECTED: 9.4 mg/dL (ref 8.9–10.8)
CALCIUM: 9.8 mg/dL (ref 8.6–10.3)
CHLORIDE: 106 mmol/L (ref 98–107)
CO2 TOTAL: 25 mmol/L (ref 21–31)
CREATININE: 0.9 mg/dL (ref 0.60–1.30)
ESTIMATED GFR: 63 mL/min/1.73mˆ2 (ref >59)
GLOBULIN: 3 (ref 2.0–3.5)
GLUCOSE: 160 mg/dL — ABNORMAL HIGH (ref 74–109)
OSMOLALITY, CALCULATED: 285 mosm/kg (ref 270–290)
POTASSIUM: 4.1 mmol/L (ref 3.5–5.1)
PROTEIN TOTAL: 7.5 g/dL (ref 6.4–8.9)
SODIUM: 139 mmol/L (ref 136–145)

## 2023-09-19 LAB — CBC WITH DIFF
BASOPHIL #: 0 x10ˆ3/uL (ref 0.00–0.10)
BASOPHIL %: 0 % (ref 0–1)
EOSINOPHIL #: 0 x10ˆ3/uL (ref 0.00–0.50)
EOSINOPHIL %: 0 % — ABNORMAL LOW (ref 1–7)
HCT: 38.4 % (ref 31.2–41.9)
HGB: 13.7 g/dL (ref 10.9–14.3)
LYMPHOCYTE #: 0.8 x10ˆ3/uL — ABNORMAL LOW (ref 1.10–3.10)
LYMPHOCYTE %: 8 % — ABNORMAL LOW (ref 16–46)
MCH: 31.6 pg (ref 24.7–32.8)
MCHC: 35.7 g/dL — ABNORMAL HIGH (ref 32.3–35.6)
MCV: 88.6 fL (ref 75.5–95.3)
MONOCYTE #: 0.5 x10ˆ3/uL (ref 0.20–0.90)
MONOCYTE %: 5 % (ref 4–11)
MPV: 7.2 fL — ABNORMAL LOW (ref 7.9–10.8)
NEUTROPHIL #: 8.2 x10ˆ3/uL (ref 1.90–8.20)
NEUTROPHIL %: 86 % — ABNORMAL HIGH (ref 43–77)
PLATELETS: 131 x10ˆ3/uL — ABNORMAL LOW (ref 140–440)
RBC: 4.33 x10ˆ6/uL (ref 3.63–4.92)
RDW: 13.1 % (ref 12.3–17.7)
WBC: 9.5 x10ˆ3/uL (ref 3.8–11.8)

## 2023-09-19 LAB — POC BLOOD GLUCOSE (RESULTS)
GLUCOSE, POC: 128 mg/dL — ABNORMAL HIGH (ref 70–100)
GLUCOSE, POC: 193 mg/dL — ABNORMAL HIGH (ref 70–100)

## 2023-09-19 LAB — MAGNESIUM: MAGNESIUM: 1.9 mg/dL (ref 1.9–2.7)

## 2023-09-19 SURGERY — ARTHROPLASTY HIP HEMI/BIPOLAR
Anesthesia: General | Site: Hip | Laterality: Right | Wound class: Clean Wound: Uninfected operative wounds in which no inflammation occurred

## 2023-09-19 MED ORDER — SERTRALINE 100 MG TABLET
100.0000 mg | ORAL_TABLET | Freq: Every day | ORAL | Status: DC
Start: 2023-09-19 — End: 2023-09-24
  Administered 2023-09-19 – 2023-09-24 (×6): 100 mg via ORAL
  Filled 2023-09-19 (×6): qty 1

## 2023-09-19 MED ORDER — ACETAMINOPHEN 325 MG TABLET
650.0000 mg | ORAL_TABLET | ORAL | Status: DC | PRN
Start: 2023-09-19 — End: 2023-09-24
  Administered 2023-09-21 – 2023-09-23 (×2): 650 mg via ORAL
  Filled 2023-09-19 (×2): qty 2

## 2023-09-19 MED ORDER — METOPROLOL TARTRATE 25 MG TABLET
25.0000 mg | ORAL_TABLET | Freq: Two times a day (BID) | ORAL | Status: DC
Start: 2023-09-19 — End: 2023-09-19

## 2023-09-19 MED ORDER — HYDRALAZINE 20 MG/ML INJECTION SOLUTION
Freq: Once | INTRAMUSCULAR | Status: DC | PRN
Start: 2023-09-19 — End: 2023-09-19
  Administered 2023-09-19: 6 mg via INTRAVENOUS

## 2023-09-19 MED ORDER — TRANEXAMIC ACID 1000 MG IN NS 50 ML IVPB - ANES
Freq: Once | INTRAVENOUS | Status: DC | PRN
Start: 2023-09-19 — End: 2023-09-19
  Administered 2023-09-19: 1000 mg via INTRAVENOUS

## 2023-09-19 MED ORDER — DILTIAZEM CD 240 MG CAPSULE,EXTENDED RELEASE 24 HR
240.0000 mg | ORAL_CAPSULE | Freq: Every day | ORAL | Status: DC
Start: 2023-09-19 — End: 2023-09-24
  Administered 2023-09-19 – 2023-09-24 (×6): 240 mg via ORAL
  Filled 2023-09-19 (×6): qty 1

## 2023-09-19 MED ORDER — PRAVASTATIN 40 MG TABLET
80.0000 mg | ORAL_TABLET | Freq: Every evening | ORAL | Status: DC
Start: 2023-09-19 — End: 2023-09-24
  Administered 2023-09-19 – 2023-09-23 (×5): 80 mg via ORAL
  Filled 2023-09-19 (×5): qty 2

## 2023-09-19 MED ORDER — PROCHLORPERAZINE EDISYLATE 10 MG/2 ML (5 MG/ML) INJECTION SOLUTION
5.0000 mg | Freq: Once | INTRAMUSCULAR | Status: DC | PRN
Start: 2023-09-19 — End: 2023-09-19

## 2023-09-19 MED ORDER — INSULIN LISPRO 100 UNIT/ML SUB-Q SSIP VIAL
2.0000 [IU] | INJECTION | Freq: Four times a day (QID) | SUBCUTANEOUS | Status: DC
Start: 2023-09-19 — End: 2023-09-24
  Administered 2023-09-19 – 2023-09-24 (×20): 0 [IU] via SUBCUTANEOUS

## 2023-09-19 MED ORDER — DEXAMETHASONE SODIUM PHOSPHATE 4 MG/ML INJECTION SOLUTION
INTRAMUSCULAR | Status: AC
Start: 2023-09-19 — End: 2023-09-19
  Filled 2023-09-19: qty 1

## 2023-09-19 MED ORDER — BISACODYL 10 MG RECTAL SUPPOSITORY
10.0000 mg | Freq: Every day | RECTAL | Status: DC | PRN
Start: 2023-09-19 — End: 2023-09-24

## 2023-09-19 MED ORDER — ETHYL ALCOHOL 62 % TOPICAL SWAB
1.0000 | Freq: Two times a day (BID) | CUTANEOUS | Status: DC
Start: 2023-09-19 — End: 2023-09-24
  Administered 2023-09-19 – 2023-09-24 (×10): 1 via NASAL

## 2023-09-19 MED ORDER — CHOLECALCIFEROL (VITAMIN D3) 25 MCG (1,000 UNIT) TABLET
1000.0000 [IU] | ORAL_TABLET | Freq: Every day | ORAL | Status: DC
Start: 2023-09-19 — End: 2023-09-24
  Administered 2023-09-19: 0 [IU] via ORAL
  Administered 2023-09-20 – 2023-09-24 (×5): 1000 [IU] via ORAL
  Filled 2023-09-19 (×5): qty 1

## 2023-09-19 MED ORDER — ROPIVACAINE (PF) 2 MG/ML (0.2 %) INJECTION SOLUTION
INTRAMUSCULAR | Status: AC
Start: 2023-09-19 — End: 2023-09-19
  Filled 2023-09-19: qty 10

## 2023-09-19 MED ORDER — ONDANSETRON HCL (PF) 4 MG/2 ML INJECTION SOLUTION
4.0000 mg | INTRAMUSCULAR | Status: DC | PRN
Start: 2023-09-19 — End: 2023-09-24

## 2023-09-19 MED ORDER — IPRATROPIUM 0.5 MG-ALBUTEROL 3 MG (2.5 MG BASE)/3 ML NEBULIZATION SOLN
3.0000 mL | INHALATION_SOLUTION | RESPIRATORY_TRACT | Status: DC | PRN
Start: 2023-09-19 — End: 2023-09-24

## 2023-09-19 MED ORDER — ESCITALOPRAM 10 MG TABLET
10.0000 mg | ORAL_TABLET | Freq: Every evening | ORAL | Status: DC
Start: 2023-09-19 — End: 2023-09-19

## 2023-09-19 MED ORDER — LOSARTAN 100 MG-HYDROCHLOROTHIAZIDE 25 MG TABLET
1.0000 | ORAL_TABLET | Freq: Every day | ORAL | Status: DC
Start: 2023-09-19 — End: 2023-09-19

## 2023-09-19 MED ORDER — GLUCAGON HCL 1 MG/ML SOLUTION FOR INJECTION
1.0000 mg | Freq: Once | INTRAMUSCULAR | Status: DC | PRN
Start: 2023-09-19 — End: 2023-09-24

## 2023-09-19 MED ORDER — DEXTROSE 50 % IN WATER (D50W) INTRAVENOUS SYRINGE
12.5000 g | INJECTION | INTRAVENOUS | Status: DC | PRN
Start: 2023-09-19 — End: 2023-09-24

## 2023-09-19 MED ORDER — ONDANSETRON HCL (PF) 4 MG/2 ML INJECTION SOLUTION
INTRAMUSCULAR | Status: AC
Start: 2023-09-19 — End: 2023-09-19
  Filled 2023-09-19: qty 2

## 2023-09-19 MED ORDER — ONDANSETRON HCL (PF) 4 MG/2 ML INJECTION SOLUTION
4.0000 mg | Freq: Four times a day (QID) | INTRAMUSCULAR | Status: DC | PRN
Start: 2023-09-19 — End: 2023-09-19
  Administered 2023-09-19: 4 mg via INTRAVENOUS
  Filled 2023-09-19: qty 2

## 2023-09-19 MED ORDER — DONEPEZIL 10 MG TABLET
10.0000 mg | ORAL_TABLET | Freq: Every day | ORAL | Status: DC
Start: 2023-09-19 — End: 2023-09-24
  Administered 2023-09-19: 0 mg via ORAL
  Administered 2023-09-20 – 2023-09-21 (×2): 10 mg via ORAL
  Administered 2023-09-22: 0 mg via ORAL
  Administered 2023-09-23 – 2023-09-24 (×2): 10 mg via ORAL
  Filled 2023-09-19 (×5): qty 1

## 2023-09-19 MED ORDER — HYDROMORPHONE 2 MG/ML INJECTION WRAPPER
INJECTION | INTRAMUSCULAR | Status: AC
Start: 2023-09-19 — End: 2023-09-19
  Filled 2023-09-19: qty 1

## 2023-09-19 MED ORDER — METOPROLOL SUCCINATE ER 25 MG TABLET,EXTENDED RELEASE 24 HR
12.5000 mg | ORAL_TABLET | Freq: Every day | ORAL | Status: DC
Start: 2023-09-19 — End: 2023-09-20
  Administered 2023-09-19 – 2023-09-20 (×2): 12.5 mg via ORAL
  Filled 2023-09-19 (×2): qty 1

## 2023-09-19 MED ORDER — NALOXONE 0.4 MG/ML INJECTION SOLUTION
0.4000 mg | INTRAMUSCULAR | Status: DC | PRN
Start: 2023-09-19 — End: 2023-09-24

## 2023-09-19 MED ORDER — SUGAMMADEX 100 MG/ML INTRAVENOUS SOLUTION
Freq: Once | INTRAVENOUS | Status: DC | PRN
Start: 2023-09-19 — End: 2023-09-19
  Administered 2023-09-19: 300 mg via INTRAVENOUS

## 2023-09-19 MED ORDER — PROPOFOL 10 MG/ML IV BOLUS
INJECTION | Freq: Once | INTRAVENOUS | Status: DC | PRN
Start: 2023-09-19 — End: 2023-09-19
  Administered 2023-09-19: 150 mg via INTRAVENOUS

## 2023-09-19 MED ORDER — FERROUS SULFATE 324 MG (65 MG IRON) TABLET,DELAYED RELEASE
324.0000 mg | DELAYED_RELEASE_TABLET | Freq: Every morning | ORAL | Status: DC
Start: 2023-09-20 — End: 2023-09-24
  Administered 2023-09-20 – 2023-09-24 (×5): 324 mg via ORAL
  Filled 2023-09-19 (×5): qty 1

## 2023-09-19 MED ORDER — OXYCODONE-ACETAMINOPHEN 5 MG-325 MG TABLET
2.0000 | ORAL_TABLET | ORAL | Status: DC | PRN
Start: 2023-09-19 — End: 2023-09-24

## 2023-09-19 MED ORDER — SODIUM CHLORIDE 0.9 % (FLUSH) INJECTION SYRINGE
3.0000 mL | INJECTION | INTRAMUSCULAR | Status: DC | PRN
Start: 2023-09-19 — End: 2023-09-19

## 2023-09-19 MED ORDER — ROPIVACAINE (PF) 2 MG/ML (0.2 %) INJECTION SOLUTION
Freq: Once | INTRAMUSCULAR | Status: DC | PRN
Start: 2023-09-19 — End: 2023-09-19
  Administered 2023-09-19: 10 mL via INTRAMUSCULAR

## 2023-09-19 MED ORDER — ONDANSETRON HCL (PF) 4 MG/2 ML INJECTION SOLUTION
4.0000 mg | Freq: Once | INTRAMUSCULAR | Status: AC
Start: 2023-09-19 — End: 2023-09-19
  Administered 2023-09-19: 4 mg via INTRAVENOUS

## 2023-09-19 MED ORDER — SODIUM CHLORIDE 0.9 % (FLUSH) INJECTION SYRINGE
3.0000 mL | INJECTION | Freq: Three times a day (TID) | INTRAMUSCULAR | Status: DC
Start: 2023-09-19 — End: 2023-09-19

## 2023-09-19 MED ORDER — MONTELUKAST 10 MG TABLET
10.0000 mg | ORAL_TABLET | Freq: Every day | ORAL | Status: DC
Start: 2023-09-19 — End: 2023-09-24
  Administered 2023-09-19: 0 mg via ORAL
  Administered 2023-09-20 – 2023-09-24 (×5): 10 mg via ORAL
  Filled 2023-09-19 (×5): qty 1

## 2023-09-19 MED ORDER — TRAMADOL 50 MG TABLET
50.0000 mg | ORAL_TABLET | Freq: Four times a day (QID) | ORAL | Status: AC
Start: 2023-09-19 — End: 2023-09-21
  Administered 2023-09-19 – 2023-09-20 (×4): 50 mg via ORAL
  Administered 2023-09-20 – 2023-09-21 (×2): 0 mg via ORAL
  Administered 2023-09-21: 50 mg via ORAL
  Administered 2023-09-21: 0 mg via ORAL
  Filled 2023-09-19 (×5): qty 1

## 2023-09-19 MED ORDER — HEPARIN (PORCINE) 5,000 UNIT/ML INJECTION SOLUTION
INTRAMUSCULAR | Status: AC
Start: 2023-09-19 — End: 2023-09-19
  Filled 2023-09-19: qty 1

## 2023-09-19 MED ORDER — FENTANYL (PF) 50 MCG/ML INJECTION SOLUTION
INTRAMUSCULAR | Status: AC
Start: 2023-09-19 — End: 2023-09-19
  Filled 2023-09-19: qty 2

## 2023-09-19 MED ORDER — CEFAZOLIN 1 GRAM SOLUTION FOR INJECTION
Freq: Once | INTRAMUSCULAR | Status: DC | PRN
Start: 2023-09-19 — End: 2023-09-19
  Administered 2023-09-19: 2000 mg via INTRAVENOUS

## 2023-09-19 MED ORDER — MORPHINE 2 MG/ML INJECTION WRAPPER
2.0000 mg | INJECTION | INTRAMUSCULAR | Status: DC | PRN
Start: 2023-09-19 — End: 2023-09-24
  Administered 2023-09-19 (×2): 2 mg via INTRAVENOUS
  Filled 2023-09-19 (×2): qty 1

## 2023-09-19 MED ORDER — FAMOTIDINE (PF) 20 MG/2 ML INTRAVENOUS SOLUTION
INTRAVENOUS | Status: AC
Start: 2023-09-19 — End: 2023-09-19
  Filled 2023-09-19: qty 2

## 2023-09-19 MED ORDER — LORAZEPAM 1 MG TABLET
1.0000 mg | ORAL_TABLET | Freq: Three times a day (TID) | ORAL | Status: DC
Start: 2023-09-19 — End: 2023-09-24
  Administered 2023-09-19 – 2023-09-24 (×14): 1 mg via ORAL
  Administered 2023-09-24: 0 mg via ORAL
  Filled 2023-09-19 (×14): qty 1

## 2023-09-19 MED ORDER — ASPIRIN 81 MG CHEWABLE TABLET
81.0000 mg | CHEWABLE_TABLET | Freq: Two times a day (BID) | ORAL | Status: DC
Start: 2023-09-20 — End: 2023-09-24
  Administered 2023-09-20 – 2023-09-24 (×9): 81 mg via ORAL
  Filled 2023-09-19 (×9): qty 1

## 2023-09-19 MED ORDER — HYDROMORPHONE 2 MG/ML INJECTION WRAPPER
0.4000 mg | INJECTION | INTRAMUSCULAR | Status: AC
Start: 2023-09-19 — End: 2023-09-19
  Administered 2023-09-19: 0.4 mg via INTRAVENOUS

## 2023-09-19 MED ORDER — TRANEXAMIC ACID 1,000 MG/10 ML (100 MG/ML) INTRAVENOUS SOLUTION
INTRAVENOUS | Status: AC
Start: 2023-09-19 — End: 2023-09-19
  Filled 2023-09-19: qty 10

## 2023-09-19 MED ORDER — METFORMIN 500 MG TABLET
500.0000 mg | ORAL_TABLET | Freq: Every day | ORAL | Status: DC
Start: 2023-09-19 — End: 2023-09-19

## 2023-09-19 MED ORDER — LACTATED RINGERS INTRAVENOUS SOLUTION
INTRAVENOUS | Status: DC
Start: 2023-09-19 — End: 2023-09-19

## 2023-09-19 MED ORDER — HYDROMORPHONE 2 MG/ML INJECTION WRAPPER
0.2500 mg | INJECTION | INTRAMUSCULAR | Status: DC | PRN
Start: 2023-09-19 — End: 2023-09-19

## 2023-09-19 MED ORDER — HYDROMORPHONE 2 MG/ML INJECTION WRAPPER
0.4000 mg | INJECTION | Freq: Once | INTRAMUSCULAR | Status: AC
Start: 2023-09-19 — End: 2023-09-19
  Administered 2023-09-19: 0.4 mg via INTRAVENOUS
  Filled 2023-09-19: qty 1

## 2023-09-19 MED ORDER — ACETAMINOPHEN 325 MG TABLET
975.0000 mg | ORAL_TABLET | Freq: Four times a day (QID) | ORAL | Status: AC
Start: 2023-09-19 — End: 2023-09-19
  Administered 2023-09-19 (×2): 975 mg via ORAL
  Filled 2023-09-19 (×2): qty 3

## 2023-09-19 MED ORDER — DEXAMETHASONE SODIUM PHOSPHATE 4 MG/ML INJECTION SOLUTION
4.0000 mg | Freq: Once | INTRAMUSCULAR | Status: AC
Start: 2023-09-19 — End: 2023-09-19
  Administered 2023-09-19: 4 mg via INTRAVENOUS

## 2023-09-19 MED ORDER — LACTATED RINGERS INTRAVENOUS SOLUTION
INTRAVENOUS | Status: DC | PRN
Start: 2023-09-19 — End: 2023-09-19
  Administered 2023-09-19: 0 via INTRAVENOUS

## 2023-09-19 MED ORDER — DEXTROSE 40 % ORAL GEL
15.0000 g | ORAL | Status: DC | PRN
Start: 2023-09-19 — End: 2023-09-24

## 2023-09-19 MED ORDER — GLYCOPYRROLATE 0.2 MG/ML INJECTION SOLUTION
Freq: Once | INTRAMUSCULAR | Status: DC | PRN
Start: 2023-09-19 — End: 2023-09-19
  Administered 2023-09-19: .2 mg via INTRAVENOUS

## 2023-09-19 MED ORDER — HYDROMORPHONE 2 MG/ML INJECTION WRAPPER
0.5000 mg | INJECTION | INTRAMUSCULAR | Status: DC | PRN
Start: 2023-09-19 — End: 2023-09-19

## 2023-09-19 MED ORDER — PANTOPRAZOLE 40 MG TABLET,DELAYED RELEASE
40.0000 mg | DELAYED_RELEASE_TABLET | Freq: Every day | ORAL | Status: DC
Start: 2023-09-19 — End: 2023-09-24
  Administered 2023-09-19: 0 mg via ORAL
  Administered 2023-09-20 – 2023-09-24 (×5): 40 mg via ORAL
  Filled 2023-09-19 (×5): qty 1

## 2023-09-19 MED ORDER — FAMOTIDINE 20 MG TABLET
20.0000 mg | ORAL_TABLET | Freq: Two times a day (BID) | ORAL | Status: AC
Start: 2023-09-19 — End: 2023-09-21
  Administered 2023-09-19 – 2023-09-21 (×4): 20 mg via ORAL
  Filled 2023-09-19 (×4): qty 1

## 2023-09-19 MED ORDER — MULTIVITAMIN WITH FOLIC ACID 400 MCG TABLET
1.0000 | ORAL_TABLET | Freq: Every day | ORAL | Status: DC
Start: 2023-09-19 — End: 2023-09-24
  Administered 2023-09-19: 0 via ORAL
  Administered 2023-09-20 – 2023-09-24 (×5): 1 via ORAL
  Filled 2023-09-19 (×5): qty 1

## 2023-09-19 MED ORDER — IPRATROPIUM 0.5 MG-ALBUTEROL 3 MG (2.5 MG BASE)/3 ML NEBULIZATION SOLN
3.0000 mL | INHALATION_SOLUTION | Freq: Once | RESPIRATORY_TRACT | Status: DC | PRN
Start: 2023-09-19 — End: 2023-09-19

## 2023-09-19 MED ORDER — SERTRALINE 100 MG TABLET
100.0000 mg | ORAL_TABLET | Freq: Every day | ORAL | Status: DC
Start: 2023-09-19 — End: 2023-09-19

## 2023-09-19 MED ORDER — LOSARTAN 50 MG TABLET
25.0000 mg | ORAL_TABLET | Freq: Every day | ORAL | Status: DC
Start: 2023-09-19 — End: 2023-09-19

## 2023-09-19 MED ORDER — FENTANYL (PF) 50 MCG/ML INJECTION WRAPPER
25.0000 ug | INJECTION | INTRAMUSCULAR | Status: DC | PRN
Start: 2023-09-19 — End: 2023-09-19

## 2023-09-19 MED ORDER — ACETAMINOPHEN 325 MG TABLET
650.0000 mg | ORAL_TABLET | ORAL | Status: DC | PRN
Start: 2023-09-19 — End: 2023-09-19

## 2023-09-19 MED ORDER — HYDROMORPHONE 2 MG/ML INJECTION WRAPPER
0.2000 mg | INJECTION | INTRAMUSCULAR | Status: DC | PRN
Start: 2023-09-19 — End: 2023-09-24
  Administered 2023-09-19: 0.2 mg via INTRAVENOUS
  Filled 2023-09-19 (×2): qty 1

## 2023-09-19 MED ORDER — MORPHINE 2 MG/ML INJECTION WRAPPER
2.0000 mg | INJECTION | INTRAMUSCULAR | Status: DC | PRN
Start: 2023-09-19 — End: 2023-09-24
  Administered 2023-09-20: 2 mg via INTRAVENOUS
  Filled 2023-09-19: qty 1

## 2023-09-19 MED ORDER — LIDOCAINE (PF) 100 MG/5 ML (2 %) INTRAVENOUS SYRINGE
INJECTION | Freq: Once | INTRAVENOUS | Status: DC | PRN
Start: 2023-09-19 — End: 2023-09-19
  Administered 2023-09-19: 80 mg via INTRAVENOUS

## 2023-09-19 MED ORDER — SODIUM CHLORIDE 0.9 % INTRAVENOUS SOLUTION
INTRAVENOUS | Status: DC
Start: 2023-09-19 — End: 2023-09-19

## 2023-09-19 MED ORDER — SODIUM CHLORIDE 0.9 % INTRAVENOUS SOLUTION
INTRAVENOUS | Status: AC
Start: 2023-09-19 — End: 2023-09-19
  Filled 2023-09-19: qty 100

## 2023-09-19 MED ORDER — HYDROMORPHONE 2 MG/ML INJECTION WRAPPER
0.2000 mg | INJECTION | INTRAMUSCULAR | Status: DC | PRN
Start: 2023-09-19 — End: 2023-09-24
  Administered 2023-09-24: 0.2 mg via INTRAVENOUS

## 2023-09-19 MED ORDER — LACTATED RINGERS INTRAVENOUS SOLUTION
INTRAVENOUS | Status: DC
Start: 2023-09-19 — End: 2023-09-24

## 2023-09-19 MED ORDER — LOSARTAN 50 MG TABLET
12.5000 mg | ORAL_TABLET | Freq: Every day | ORAL | Status: DC
Start: 2023-09-19 — End: 2023-09-19

## 2023-09-19 MED ORDER — FENTANYL (PF) 50 MCG/ML INJECTION WRAPPER
50.0000 ug | INJECTION | Freq: Once | INTRAMUSCULAR | Status: AC
Start: 2023-09-19 — End: 2023-09-19
  Administered 2023-09-19: 50 ug via INTRAVENOUS

## 2023-09-19 MED ORDER — DIPHENHYDRAMINE 50 MG/ML INJECTION SOLUTION
12.5000 mg | Freq: Four times a day (QID) | INTRAMUSCULAR | Status: DC | PRN
Start: 2023-09-19 — End: 2023-09-24
  Administered 2023-09-19: 12.5 mg via INTRAVENOUS

## 2023-09-19 MED ORDER — OXYCODONE-ACETAMINOPHEN 5 MG-325 MG TABLET
1.0000 | ORAL_TABLET | ORAL | Status: DC | PRN
Start: 2023-09-19 — End: 2023-09-24
  Administered 2023-09-22 – 2023-09-23 (×4): 1 via ORAL
  Filled 2023-09-19 (×4): qty 1

## 2023-09-19 MED ORDER — CEFAZOLIN 2 GRAM INTRAVENOUS SOLUTION
INTRAVENOUS | Status: AC
Start: 2023-09-19 — End: 2023-09-19
  Filled 2023-09-19: qty 14.71

## 2023-09-19 MED ORDER — ALBUTEROL SULFATE 2.5 MG/3 ML (0.083 %) SOLUTION FOR NEBULIZATION
2.5000 mg | INHALATION_SOLUTION | Freq: Once | RESPIRATORY_TRACT | Status: DC | PRN
Start: 2023-09-19 — End: 2023-09-19

## 2023-09-19 MED ORDER — FENTANYL (PF) 50 MCG/ML INJECTION WRAPPER
INJECTION | Freq: Once | INTRAMUSCULAR | Status: DC | PRN
Start: 2023-09-19 — End: 2023-09-19
  Administered 2023-09-19 (×2): 50 ug via INTRAVENOUS
  Administered 2023-09-19: 100 ug via INTRAVENOUS

## 2023-09-19 MED ORDER — LOSARTAN 50 MG TABLET
25.0000 mg | ORAL_TABLET | Freq: Every day | ORAL | Status: DC
Start: 2023-09-19 — End: 2023-09-22
  Administered 2023-09-19 – 2023-09-22 (×4): 25 mg via ORAL
  Filled 2023-09-19 (×4): qty 1

## 2023-09-19 MED ORDER — ROCURONIUM 10 MG/ML INTRAVENOUS SOLUTION
Freq: Once | INTRAVENOUS | Status: DC | PRN
Start: 2023-09-19 — End: 2023-09-19
  Administered 2023-09-19: 60 mg via INTRAVENOUS

## 2023-09-19 MED ORDER — KETOROLAC 30 MG/ML (1 ML) INJECTION SOLUTION
15.0000 mg | Freq: Three times a day (TID) | INTRAMUSCULAR | Status: AC
Start: 2023-09-19 — End: 2023-09-21
  Administered 2023-09-19 – 2023-09-20 (×4): 15 mg via INTRAVENOUS
  Administered 2023-09-21 (×2): 0 mg via INTRAVENOUS
  Filled 2023-09-19 (×5): qty 1

## 2023-09-19 MED ORDER — SODIUM CHLORIDE 0.9 % INTRAVENOUS SOLUTION
INTRAVENOUS | Status: DC
Start: 2023-09-19 — End: 2023-09-19
  Administered 2023-09-19: 0 mL via INTRAVENOUS

## 2023-09-19 MED ORDER — FENTANYL (PF) 50 MCG/ML INJECTION WRAPPER
50.0000 ug | INJECTION | INTRAMUSCULAR | Status: DC | PRN
Start: 2023-09-19 — End: 2023-09-19

## 2023-09-19 MED ORDER — FAMOTIDINE (PF) 20 MG/2 ML INTRAVENOUS SOLUTION
20.0000 mg | Freq: Once | INTRAVENOUS | Status: AC
Start: 2023-09-19 — End: 2023-09-19
  Administered 2023-09-19: 20 mg via INTRAVENOUS

## 2023-09-19 MED ORDER — HEPARIN (PORCINE) 5,000 UNIT/ML INJECTION SOLUTION
5000.0000 [IU] | Freq: Three times a day (TID) | INTRAMUSCULAR | Status: DC
Start: 2023-09-19 — End: 2023-09-24
  Administered 2023-09-19 (×2): 0 [IU] via SUBCUTANEOUS
  Administered 2023-09-19 – 2023-09-23 (×13): 5000 [IU] via SUBCUTANEOUS
  Administered 2023-09-24: 0 [IU] via SUBCUTANEOUS
  Administered 2023-09-24: 5000 [IU] via SUBCUTANEOUS
  Filled 2023-09-19 (×13): qty 1

## 2023-09-19 MED ORDER — ONDANSETRON HCL (PF) 4 MG/2 ML INJECTION SOLUTION
4.0000 mg | Freq: Once | INTRAMUSCULAR | Status: DC | PRN
Start: 2023-09-19 — End: 2023-09-19

## 2023-09-19 MED ORDER — DOCUSATE SODIUM 100 MG CAPSULE
100.0000 mg | ORAL_CAPSULE | Freq: Two times a day (BID) | ORAL | Status: DC
Start: 2023-09-19 — End: 2023-09-24
  Administered 2023-09-19 – 2023-09-24 (×10): 100 mg via ORAL
  Filled 2023-09-19 (×10): qty 1

## 2023-09-19 MED ORDER — PROCHLORPERAZINE EDISYLATE 10 MG/2 ML (5 MG/ML) INJECTION SOLUTION
10.0000 mg | Freq: Four times a day (QID) | INTRAMUSCULAR | Status: DC | PRN
Start: 2023-09-19 — End: 2023-09-24
  Administered 2023-09-19: 10 mg via INTRAVENOUS
  Filled 2023-09-19: qty 2

## 2023-09-19 SURGICAL SUPPLY — 41 items
ADH SKNCLS 2-OCTYL CYNCRLT DRMBND PRINEO LIQUID 22CM (SUTURE AIDS) ×1 IMPLANT
BLADE 10 2 END CBNSTL SURG STRL DISP (SURGICAL CUTTING SUPPLIES) ×3 IMPLANT
BLADE 15 2 END CBNSTL SURG STRL DISP (SURGICAL CUTTING SUPPLIES) ×1 IMPLANT
BLADE SAW 73X25MM 2 CUT SGTL SS THK.89MM MED W STRL LF (SURGICAL CUTTING SUPPLIES) ×1 IMPLANT
CLEANER INSTRUMENT PRE-KLENZ 13.5 OZ (MISCELLANEOUS PT CARE ITEMS) ×1 IMPLANT
COUNTER 20 CNT BLOCK ADH NEEDLE STRL LF  RD SHARP FOAM 15.75X11.5X14IN DISP (MED SURG SUPPLIES) ×1 IMPLANT
COVER EQP 90X60IN HVDTY BCK PAD FNFLD BLU STRL (DRAPE/PACKS/SHEETS/OR TOWEL) ×4 IMPLANT
DEVICE SUCT 2 FILTER CHAMBER SCKR STRL LF  DISP (MED SURG SUPPLIES) ×1 IMPLANT
DRAPE 2 INCS FILM ANTIMIC 33X23IN IOBN STRL SURG (DRAPE/PACKS/SHEETS/OR TOWEL) ×1 IMPLANT
DRAPE 76X55IN 3 QT HALYARD LF  STRL DISP SURG (DRAPE/PACKS/SHEETS/OR TOWEL) ×2 IMPLANT
DRAPE BACK TABLE 110IN X 80IN FAN FOLD HEAVY DUTY PADDED (DRAPE/PACKS/SHEETS/OR TOWEL) ×1 IMPLANT
DRESS COMPRESS 13FTX6IN JNS COTTON RL HI ABS LF  STRL .5LB (WOUND CARE SUPPLY) ×2 IMPLANT
DRESS TRNSPR 8X6IN POLYUR ADH HYPOALL WTPRF BASIC TGDRM STRL LF (WOUND CARE SUPPLY) IMPLANT
GLOVE SURG 6.5 LF  PF BEAD CUF STRL CRM 11.3IN PROTEXIS PI PLISPRN THK9.1 MIL (GLOVES AND ACCESSORIES) ×1 IMPLANT
GLOVE SURG 6.5 LF  PF SMOOTH BEAD CUF INTLK STRL BLU 11.3IN PROTEXIS NEU-THERA PLISPRN THK7.9 MIL (GLOVES AND ACCESSORIES) ×2 IMPLANT
GLOVE SURG 7 LF  PF BEAD CUF STRL CRM 11.8IN PROTEXIS PI PLISPRN THK9.1 MIL (GLOVES AND ACCESSORIES) ×2 IMPLANT
GLOVE SURG 7.5 LF  PF SMOOTH BEAD CUF INTLK STRL BLU 11.8IN PROTEXIS NEU-THERA PLISPRN THK7.9 MIL (GLOVES AND ACCESSORIES) ×1 IMPLANT
GLOVE SURG 7.5 LTX PF SMOOTH BEAD CUF STRL YW 12IN PROTEXIS (GLOVES AND ACCESSORIES) ×1 IMPLANT
GLOVE SURG 8 LTX PF SMOOTH BEAD CUF STRL YW 12IN PROTEXIS NEU-THERA DDRGL THK8.7 MIL (GLOVES AND ACCESSORIES) ×1 IMPLANT
GOWN SURG 3XL STD LGTH L3 HKLP CLSR RGLN SLEEVE TWL STRL LF  DISP GRN AERO BLU PRFRM FBRC (DRAPE/PACKS/SHEETS/OR TOWEL) ×1 IMPLANT
GOWN SURG XL STD LGTH L3 HKLP CLSR RGLN SLEEVE TWL STRL LF  DISP GRN AERO BLU PRFRM FBRC (DRAPE/PACKS/SHEETS/OR TOWEL) ×1 IMPLANT
GOWN SURGICAL SIRUS SMS LG XLONG 52IN LEV 4 STRL LF DISP BLUE (GLOVES AND ACCESSORIES) ×1 IMPLANT
HEAD UHR COCR UHMWPE HIP FEM UNIV BIPOLAR 46MM 28MM (IMPLANTS HIP) ×1 IMPLANT
HEAD V40 LFIT 28MM +0MM OFST TAPER HIP COCR FEM PRIM STRL (IMPLANTS HIP) ×1 IMPLANT
HEMOSTAT ABS 14X2IN FLXB SHR W_V SRGCL STRL DISP (WOUND CARE SUPPLY) ×1 IMPLANT
IMPLANT HIP STEM 107MM NK 38.5MM INSG 6 HI STEM HI OFST STRL (IMPLANTS HIP) ×1 IMPLANT
LABEL MED CORRECT MED LABELING SYS 4 FLG 2 SHEET 24 PRPRNT STRL (MED SURG SUPPLIES) ×1 IMPLANT
PENCIL SMOKEVAC COAT PSHBTN LF (MED SURG SUPPLIES) ×1 IMPLANT
SET INTPLS SUCT TUBE FAN SPRAY TIP HANDPC STRL LF  DISP (MED SURG SUPPLIES) ×1 IMPLANT
SOL IRRG 0.9% NACL 2000ML PRSV FR N-PYRG FLXB CONTAINR STRL LF (MEDICATIONS/SOLUTIONS) ×1 IMPLANT
SOL SURG PREP 26ML DRPRP 74% ISPRP 0.7% IOD POVACRYLEX SLF CNTN APPL SKIN STRL PREOP (MED SURG SUPPLIES) ×1 IMPLANT
STAPLER SKIN 4.1X6.5MM 35 W STPL CART LF  APS U DISP CLR SS PLASTIC (WOUND CARE SUPPLY) IMPLANT
STKNT ORTHO 48X12IN COTTON EZ PUL TAB EXTENSIVE LINE IMPRV (ORTHOPEDICS (NOT IMPLANTS)) ×1 IMPLANT
SUTURE 1 CT STRATAFIX PDS + 18IN VIOL ABS KNOTLESS TISS CONTROL SMTR ABS (SUTURE/WOUND CLOSURE) ×1 IMPLANT
SUTURE 1 CT VICRYL 36IN VIOL BRD COAT ABS (SUTURE/WOUND CLOSURE) ×1 IMPLANT
SUTURE 2-0 CT1 TAPERPOINT VICRYL+ 27IN UNDYED BRD ANBCTRL COAT ABS (SUTURE/WOUND CLOSURE) ×2 IMPLANT
SUTURE 4-0 FS1 PROLENE 18IN BLU MONOF NONAB (SUTURE/WOUND CLOSURE) ×1 IMPLANT
SUTURE 5 V-37 ETHIBOND 30IN GRN BRD 4 STRN NONAB (SUTURE/WOUND CLOSURE) ×1 IMPLANT
TOWEL 24X16IN COTTON BLU DISP SURG STRL LF (DRAPE/PACKS/SHEETS/OR TOWEL) ×3 IMPLANT
TRAY ~~LOC~~ ARTHROSCOPY ~~LOC~~ - ~~LOC~~ COMMUNITY HOSP (CUSTOM TRAYS & PACK) ×1 IMPLANT
WOUND IRRG IRRISEPT DBRD CLNSG 0.05% CHG SYSTEM STRL LF (WOUND CARE SUPPLY) ×1 IMPLANT

## 2023-09-19 NOTE — Anesthesia Preprocedure Evaluation (Signed)
 ANESTHESIA PRE-OP EVALUATION  Planned Procedure: OPEN REDUCTION INTERNAL FIXATION RIGHT HIP FRACTURE USING BIPLOAR IMPLANTS (Right: Hip)  Review of Systems         patient summary reviewed  nursing notes reviewed        Pulmonary   COPD,   Cardiovascular    Hypertension and hyperlipidemia ,No peripheral edema,        GI/Hepatic/Renal    GERD        Endo/Other    anemia and rheumatoid arthritis,   type 2 diabetes    Neuro/Psych/MS    depression     Cancer                  Physical Assessment      Airway       Mallampati: II    TM distance: 3 FB    Neck ROM: full  Mouth Opening: fair.            Dental                    Pulmonary    Breath sounds clear to auscultation  (-) no rhonchi, no decreased breath sounds, no wheezes, no rales and no stridor     Cardiovascular    Rhythm: regular  Rate: Normal  (-) no friction rub, carotid bruit is not present, no peripheral edema and no murmur     Other findings          Plan  ASA 2     Planned anesthesia type: general     general anesthesia with endotracheal tube intubation      PONV Plan:  I plan to administer pharmcologic prophalaxis antiemetics  POV PLAN:   plan for postoperative opioid use    SLEEP APNEA  Patient is at risk of obstructive sleep apnea and Education provided regarding risk of obstructive sleep apnea        Intravenous induction     Anesthesia issues/risks discussed are: Dental Injuries, Nerve Injuries, PONV, Eye /Visual Loss, Post-op Intubation/Ventilation, Art Line Placement, Post-op Cognitive Dysfunction, Post-op Pain Management, Post-op Agitation/Tantrum, Cardiac Events/MI, Intraoperative Awareness/ Recall, Stroke, Aspiration, Blood Loss and Sore Throat.  Anesthetic plan and risks discussed with patient  signed consent obtained          Patient's NPO status is appropriate for Anesthesia.           Plan discussed with CRNA.

## 2023-09-19 NOTE — Anesthesia Transfer of Care (Signed)
 ANESTHESIA TRANSFER OF CARE   Karen Chase is a 83 y.o. ,female, Weight: 70.7 kg (155 lb 14.4 oz)   had Procedure(s):  OPEN REDUCTION INTERNAL FIXATION RIGHT HIP FRACTURE USING BIPLOAR IMPLANTS  performed  09/19/23   Primary Service: Glenford Boga, MD    Past Medical History:   Diagnosis Date   . Chronic obstructive pulmonary disease, unspecified 10/31/2022   . Esophageal reflux     controlled with med   . History of tuberculosis     as teenager   . Hyperlipidemia    . Hypertension    . Type 2 diabetes mellitus       Allergy History as of 09/19/23       SULFA (SULFONAMIDES)         Noted Status Severity Type Reaction    05/06/21 1639 Betti Males, RN 05/06/21 Active Medium  Hives/ Urticaria                  I completed my transfer of care / handoff to the receiving personnel during which we discussed:  Access, Airway, All key/critical aspects of case discussed, Analgesia, Antibiotics, Expectation of post procedure, Fluids/Product, Gave opportunity for questions and acknowledgement of understanding, Labs and PMHx      Post Location: PACU                                                           Last OR Temp: Temperature: 36.1 C (97 F)  ABG:  POTASSIUM   Date Value Ref Range Status   09/19/2023 4.1 3.5 - 5.1 mmol/L Final     KETONES   Date Value Ref Range Status   05/06/2021 Negative Negative, Trace mg/dL Final     CALCIUM   Date Value Ref Range Status   09/19/2023 9.8 8.6 - 10.3 mg/dL Final     Calculated P Axis   Date Value Ref Range Status   09/18/2023 73 degrees Final     Calculated R Axis   Date Value Ref Range Status   09/18/2023 55 degrees Final     Calculated T Axis   Date Value Ref Range Status   09/18/2023 24 degrees Final     Airway:* No LDAs found *  Blood pressure (!) 146/70, pulse (!) 104, temperature 36.1 C (97 F), resp. rate 16, height 1.575 m (5' 2), weight 70.7 kg (155 lb 14.4 oz), SpO2 92%.

## 2023-09-19 NOTE — H&P (Signed)
 [place] MEDICINE [facility]    HOSPITALIST H&P    Karen Chase 83 y.o. female ED23/ED23   Date of Service: 09/19/2023    Date of Admission:  09/18/2023   PCP: Karen DELENA Bence, MD Code Status:FULL CODE: ATTEMPT RESUSCITATION/CPR       Chief Complaint:  Trip and fall     HPI:   This 83 year old female with a known past medical history of Chronic Obstructive Pulmonary Disease, chronic kidney disease, hypertension, diabetes mellitus, and CAD presents emergency department via EMS status post trip and fall at home.  Patient was found to have right femoral neck fracture.  Orthopedics was consulted from the emergency department agreeable to follow with the patient from tomorrow morning.  Hospitalist to admit.  Patient's home medications will need to be reconciled once they are significantly gone over in the computer.  Patient does normally follow with Dr.Qazi for cardiac issues.  No history of echo in our EMR.  Patient shows no significant leg swelling.  Patient's lung sounds are equal and mildly diminished.  No advantageous sounds appreciated.  We will put on hospitalist sliding scale.    ED medications:   Medications Ordered/Administered in the ED   ondansetron  (ZOFRAN ) 2 mg/mL injection (has no administration in time range)   morphine  4 mg/mL injection (has no administration in time range)         PMHx:    Past Medical History:   Diagnosis Date    Chronic obstructive pulmonary disease, unspecified 10/31/2022    Esophageal reflux     controlled with med    History of tuberculosis     as teenager    Hyperlipidemia     Hypertension     Type 2 diabetes mellitus     PSHx:   Past Surgical History:   Procedure Laterality Date    COLONOSCOPY      HAND SURGERY Right     HX HYSTERECTOMY      HX LAP CHOLECYSTECTOMY         Allergies:    Allergies[1] Social History  Social History[2]    Family History  Family Medical History:    None            Home Meds:      Prior to Admission medications    Medication Sig Start Date  End Date Taking? Authorizing Provider   alendronate (FOSAMAX) 70 mg Oral Tablet     Provider, Historical   B complex-vitamin C-folic acid  (NEPHRO-VITE) 0.8 mg Oral Tablet Take 1 Tablet by mouth Once a day    Provider, Historical   cetirizine  (ZYRTEC ) 10 mg Oral Tablet Take 1 Tablet (10 mg total) by mouth Every evening 10/31/22   Lentner, Barnie SQUIBB, PA-C   Cimetidine (TAGAMET HB) 200 mg Oral Tablet Take 1 tablet twice a day by oral route.    Provider, Historical   dilTIAZem  (CARDIZEM  CD) 240 mg Oral Capsule, Sust. Release 24 hr Take 1 Capsule (240 mg total) by mouth Once a day    Provider, Historical   donepeziL  (ARICEPT ) 10 mg Oral Tablet Take 1 Tablet (10 mg total) by mouth Once a day    Provider, Historical   ergocalciferol, vitamin D2, (DRISDOL) 1,250 mcg (50,000 unit) Oral Capsule Take 1 Capsule (50,000 Units total) by mouth Every 7 days    Provider, Historical   escitalopram  oxalate (LEXAPRO ) 10 mg Oral Tablet     Provider, Historical   fluticasone propionate (FLONASE) 50 mcg/actuation Nasal Spray, Suspension  Provider, Historical   GEMTESA 75 mg Oral Tablet     Provider, Historical   loratadine (CLARITIN) 10 mg Oral Tablet Take 1 Tablet (10 mg total) by mouth Once a day    Provider, Historical   LORazepam  (ATIVAN ) 1 mg Oral Tablet Take 1 Tablet (1 mg total) by mouth Three times a day    Provider, Historical   losartan  (COZAAR ) 25 mg Oral Tablet Take 1 tablet every day by oral route for 30 days.    Provider, Historical   losartan -hydrochlorothiazide  (HYZAAR ) 100-25 mg Oral Tablet Take 1 Tablet by mouth Once a day    Provider, Historical   metFORMIN  (GLUCOPHAGE ) 500 mg Oral Tablet Take 1 Tablet (500 mg total) by mouth Once a day    Provider, Historical   metoprolol  succinate (TOPROL -XL) 25 mg Oral Tablet Sustained Release 24 hr  10/09/22   Provider, Historical   metoprolol  tartrate (LOPRESSOR ) 25 mg Oral Tablet Take 1 Tablet (25 mg total) by mouth Twice daily    Provider, Historical   montelukast  (SINGULAIR ) 10  mg Oral Tablet Take 1 Tablet (10 mg total) by mouth Once a day 10/31/22   Lentner, Barnie SQUIBB, PA-C   pantoprazole  (PROTONIX ) 40 mg Oral Tablet, Delayed Release (E.C.) Take 1 Tablet (40 mg total) by mouth Once a day    Provider, Historical   pravastatin  (PRAVACHOL ) 80 mg Oral Tablet  09/07/22   Provider, Historical   sertraline  (ZOLOFT ) 100 mg Oral Tablet Take 1 Tablet (100 mg total) by mouth Once a day    Provider, Historical   traMADoL  (ULTRAM ) 50 mg Oral Tablet Take 1 Tablet (50 mg total) by mouth Every 6 hours as needed for Pain    Provider, Historical   vitamin D3-vitamin K2 1,250-200 mcg Oral Capsule Take 1 Capsule by mouth    Provider, Historical          ROS:   General: No fever or chills. No weight changes, fatigue, weakness.   HEENT: No headaches, dizziness, changes in vision, changes in hearing, or difficulty swallowing.    Skin:  No rashes, erythema or bruises.   Cardiac: No chest pain, palpitations, or arrhythmia.    Respiratory: No shortness of breath, cough, or wheezing.  GI: No nausea or vomiting. No abdominal pain.   Urinary: No dysuria, hematuria, or change in frequency.    Vascular: No edema.     Musculoskeletal:  Right leg pain  Neurologic: No loss of sensation, numbness or tingling.   Endocrine: No heat or cold intolerance or polydipsia.   Psychiatric: No insomnia, depression or anxiety.      Results for orders placed or performed during the hospital encounter of 09/18/23 (from the past 24 hours)   ECG 12 LEAD   Result Value Ref Range    Ventricular rate 79 BPM    Atrial Rate 79 BPM    PR Interval 198 ms    QRS Duration 84 ms    QT Interval 412 ms    QTC Calculation 472 ms    Calculated P Axis 73 degrees    Calculated R Axis 55 degrees    Calculated T Axis 24 degrees   COMPREHENSIVE METABOLIC PANEL, NON-FASTING   Result Value Ref Range    SODIUM 141 136 - 145 mmol/L    POTASSIUM 4.1 3.5 - 5.1 mmol/L    CHLORIDE 108 (H) 98 - 107 mmol/L    CO2 TOTAL 24 21 - 31 mmol/L    ANION GAP 9 4 -  13 mmol/L     BUN 28 (H) 7 - 25 mg/dL    CREATININE 8.97 9.39 - 1.30 mg/dL    BUN/CREA RATIO 27 (H) 6 - 22    ESTIMATED GFR 55 (L) >59 mL/min/1.61m^2    ALBUMIN 4.4 3.5 - 5.7 g/dL    CALCIUM 89.8 8.6 - 89.6 mg/dL    GLUCOSE 841 (H) 74 - 109 mg/dL    ALKALINE PHOSPHATASE 58 34 - 104 U/L    ALT (SGPT) 23 7 - 52 U/L    AST (SGOT) 25 13 - 39 U/L    BILIRUBIN TOTAL 0.6 0.3 - 1.0 mg/dL    PROTEIN TOTAL 7.5 6.4 - 8.9 g/dL    ALBUMIN/GLOBULIN RATIO 1.4 0.8 - 1.4    OSMOLALITY, CALCULATED 290 270 - 290 mOsm/kg    CALCIUM, CORRECTED 9.8 8.9 - 10.8 mg/dL    GLOBULIN 3.1 2.0 - 3.5   PT/INR   Result Value Ref Range    PROTHROMBIN TIME 11.7 9.8 - 12.7 seconds    INR 1.04 0.84 - 1.10   PTT (PARTIAL THROMBOPLASTIN TIME)   Result Value Ref Range    APTT 29.2 25.0 - 38.0 seconds   TYPE AND SCREEN   Result Value Ref Range    UNITS ORDERED NOT STATED     ABO/RH(D) A NEGATIVE     ANTIBODY SCREEN NEGATIVE     SPECIMEN EXPIRATION DATE 09/21/2023,2359    CBC WITH DIFF   Result Value Ref Range    WBC 8.0 3.8 - 11.8 x10^3/uL    RBC 4.34 3.63 - 4.92 x10^6/uL    HGB 13.5 10.9 - 14.3 g/dL    HCT 61.6 68.7 - 58.0 %    MCV 88.3 75.5 - 95.3 fL    MCH 31.2 24.7 - 32.8 pg    MCHC 35.4 32.3 - 35.6 g/dL    RDW 87.1 87.6 - 82.2 %    PLATELETS 136 (L) 140 - 440 x10^3/uL    MPV 7.2 (L) 7.9 - 10.8 fL    NEUTROPHIL % 80 (H) 43 - 77 %    LYMPHOCYTE % 12 (L) 16 - 46 %    MONOCYTE % 6 4 - 11 %    EOSINOPHIL % 2 1 - 7 %    BASOPHIL % 0 0 - 1 %    NEUTROPHIL # 6.50 1.90 - 8.20 x10^3/uL    LYMPHOCYTE # 1.00 (L) 1.10 - 3.10 x10^3/uL    MONOCYTE # 0.40 0.20 - 0.90 x10^3/uL    EOSINOPHIL # 0.10 0.00 - 0.50 x10^3/uL    BASOPHIL # 0.00 0.00 - 0.10 x10^3/uL          Physical:  Filed Vitals:    09/18/23 2127 09/18/23 2300   BP: (!) 170/81    Pulse: 78 85   Resp: 18 18   Temp: 36.2 C (97.2 F)    SpO2: 98%       General: Patient is alert and oriented to person, place, and time. No acute distress. Communicates appropriately.   Head: Normocephalic and atraumatic.    Eyes: Pupils  equally round and react to light and accommodate. Extraocular movements intact.  Conjunctiva normal. Sclerae are normal.    Nose: Nasal passages clear. Mucosa moist.    Throat: Moist oral mucosa. No erythema or exudate of the pharynx. Clear oropharynx.    Neck: Supple. No cervical lymphadenopathy or supraclavicular nodes detected. Trachea midline   Heart: Regular rate and rhythm. S1 & S2 present.  No S3 or S4. No rubs, gallops, or murmurs appreciated.   Lungs: Clear to auscultation bilaterally with no wheezes or rales. Equal chest excursion.  No conversational dyspnea. No respiratory distress noted.   Abdomen: Soft, nontender, nondistended belly. Bowel sounds are present in all four quadrants. No rigidity.  No guarding.  No ascites.   Extremities: No edema, cyanosis, or clubbing. Grossly moves all extremities.    Skin: Warm and dry without lesions. No ecchymosis noted.    Neurologic: Cranial nerves II through XII are grossly intact.Strength 5/5 in upper extremities and lower extremities bilaterally.    Genitourinary:  No urinary incontinence or Foley catheter   Psychiatric: Judgment and insight are intact. Mood and affect are appropriate for the situation.       Diagnostic studies:  No results found.       EKG interpretation:     @PEVF @    Assessment:  Active Hospital Problems   (*Primary Problem)    Diagnosis    *Fracture of right hip     Fracture of the right hip  Placed on telemetry  Orthopedic consult    2. We will continue to monitor closely on telemetry for patient's known past medical history.  We will place on hospitalist sliding scale.    Plan:  Patient will be admitted for the above problems.  Repeat labs for this a.m.SABRA  Patient's risk stratification for any major cardiac event is minimal according to her Charlanne score.  We will need to monitor for any significant bleeding.      The hospitalist has examined patient, and reviewed all material, and agrees with the above medical management at this time.      DVT  prophylaxis:  Heparin       Manus Clara, FNP-BC    [place] [facility] HOSPITALIST       [1]   Allergies  Allergen Reactions    Sulfa (Sulfonamides) Hives/ Urticaria   [2]   Social History  Tobacco Use    Smoking status: Never    Smokeless tobacco: Never   Substance Use Topics    Alcohol  use: Not Currently    Drug use: Not Currently

## 2023-09-19 NOTE — Progress Notes (Signed)
 [facility]               IP PROGRESS NOTE      Gerlean, Cid M  Date of Admission:  09/18/2023  Date of Birth:  1941-02-12  Date of Service:  09/19/2023    Hospital Day:  LOS: 1 day     Subjective:   Karen Chase is a 83 y.o. female with a known past medical history of Chronic Obstructive Pulmonary Disease, chronic kidney disease, hypertension, diabetes mellitus, and CAD presented to the ED with right hip pain after mechanical fall over door threshold. Patient was found to have right femoral neck fracture.  Orthopedics was consulted from the emergency department, agreeable to follow. Patient does normally follow with Dr.Qazi for cardiac problems.  No history of echo in our EMR.      09/19/23 - pt seen and examined at bedside. Home meds reviewed. Notes pain to the right leg and hip. Surgery scheduled for today. Not other complaints at this time.    Vital Signs:  Temp (24hrs) Max:36.8 C (98.2 F)      Temperature: 36.6 C (97.9 F)  BP (Non-Invasive): (!) 160/90  MAP (Non-Invasive): 110 mmHG  Heart Rate: 81  Respiratory Rate: 17  SpO2: 97 %    Current Medications:  acetaminophen  (TYLENOL ) tablet, 650 mg, Oral, Q4H PRN  Correction/SSIP insulin  lispro 100 units/mL injection, 2-9 Units, Subcutaneous, 4x/day AC  dextrose  (GLUTOSE) 40% oral gel, 15 g, Oral, Q15 Min PRN  dextrose  50% (0.5 g/mL) injection - syringe, 12.5 g, Intravenous, Q15 Min PRN  glucagon  injection 1 mg, 1 mg, IntraMUSCULAR, Once PRN  heparin  5,000 unit/mL injection, 5,000 Units, Subcutaneous, Q8HRS  HYDROmorphone  (DILAUDID ) 2 mg/mL injection, 0.2 mg, Intravenous, Q4H PRN  ipratropium-albuterol  0.5 mg-3 mg(2.5 mg base)/3 mL Solution for Nebulization, 3 mL, Nebulization, Q4H PRN  losartan  (COZAAR ) tablet, 25 mg, Oral, Daily  metoprolol  tartrate (LOPRESSOR ) tablet, 25 mg, Oral, 2x/day  morphine  2 mg/mL injection, 2 mg, Intravenous, Q3H PRN  NS premix infusion, , Intravenous, Continuous  ondansetron  (ZOFRAN ) 2 mg/mL injection, 4 mg,  Intravenous, Q6H PRN  pantoprazole  (PROTONIX ) delayed release tablet, 40 mg, Oral, Daily  pravastatin  (PRAVACHOL ) tablet, 80 mg, Oral, QPM  prochlorperazine  (COMPAZINE ) 5 mg/mL injection, 10 mg, Intravenous, Q6H PRN  sertraline  (ZOLOFT ) tablet, 100 mg, Oral, Daily        Current Orders:  Active Orders   Diet    DIET NPO - NOW     Frequency: All Meals     Number of Occurrences: 1 Occurrences   Nursing    ACTIVITY     Frequency: UNTIL DISCONTINUED     Number of Occurrences: Until Specified    HYPOGLYCEMIA MANAGEMENT - CONSCIOUS PATIENT W/DIET ORDER     Frequency: UNTIL DISCONTINUED     Number of Occurrences: Until Specified    HYPOGLYCEMIA MANAGEMENT - UNCONSCIOUS/ALTERED/NPO PATIENT     Frequency: UNTIL DISCONTINUED     Number of Occurrences: Until Specified    HYPOGLYCEMIA TREATMENT ALGORITHM     Frequency: UNTIL DISCONTINUED     Number of Occurrences: Until Specified    INTAKE AND OUTPUT QSHIFT     Frequency: QSHIFT     Number of Occurrences: Until Specified    Notify MD Vital Signs     Frequency: PRN     Number of Occurrences: Until Specified    PT IS INTERMEDIATE RISK FOR VENOUS THROMBOEMBOLISM     Frequency: CONTINUOUS     Number of Occurrences: Until  Specified    PULSE OXIMETRY Q4H     Frequency: Q4H     Number of Occurrences: Until Specified    TELEMETRY MONITORING - Continuous     Frequency: CONTINUOUS     Number of Occurrences: Until Specified    VITAL SIGNS  Q4H     Frequency: Q4H     Number of Occurrences: Until Specified    WEIGH PATIENT     Frequency: DAILY (0600)     Number of Occurrences: Until Specified    WEIGH PATIENT     Frequency: UPON ADMISSION     Number of Occurrences: 1 Occurrences   Code Status    FULL CODE: ATTEMPT RESUSCITATION / CPR     Frequency: CONTINUOUS     Number of Occurrences: Until Specified     Order Comments: Patient wishes for full ICU level care including advanced airway interventions / mechanical ventilation.     In the event of pulseless cardiac arrest, patient consents to  ACLS (advanced cardiac life support) to attempt resuscitation.  IE - Consents to chest compressions, life support including intubation, mechanical ventilation, defibrillation/cardioversion as indicated.         Consult    IP CONSULT TO ORTHOPEDICS Requested Provider; JOESPH KOCHER     Frequency: ONE TIME     Number of Occurrences: 1 Occurrences   Point of Care Testing    PERFORM POC WHOLE BLOOD GLUCOSE     Frequency: TID AC & HS     Number of Occurrences: Until Specified   Medications    acetaminophen  (TYLENOL ) tablet     Frequency: Q4H PRN     Dose: 650 mg     Route: Oral    Correction/SSIP insulin  lispro 100 units/mL injection     Frequency: 4x/day AC     Dose: 2-9 Units     Route: Subcutaneous    dextrose  (GLUTOSE) 40% oral gel     Frequency: Q15 Min PRN     Dose: 15 g     Route: Oral    dextrose  50% (0.5 g/mL) injection - syringe     Frequency: Q15 Min PRN     Dose: 12.5 g     Route: Intravenous    glucagon  injection 1 mg     Frequency: Once PRN     Dose: 1 mg     Route: IntraMUSCULAR    heparin  5,000 unit/mL injection     Frequency: Q8HRS     Dose: 5,000 Units     Route: Subcutaneous    HYDROmorphone  (DILAUDID ) 2 mg/mL injection     Frequency: Q4H PRN     Dose: 0.2 mg     Route: Intravenous    ipratropium-albuterol  0.5 mg-3 mg(2.5 mg base)/3 mL Solution for Nebulization     Frequency: Q4H PRN     Dose: 3 mL     Route: Nebulization    losartan  (COZAAR ) tablet     Frequency: Daily     Dose: 25 mg     Route: Oral    metoprolol  tartrate (LOPRESSOR ) tablet     Frequency: 2x/day     Dose: 25 mg     Route: Oral    morphine  2 mg/mL injection     Frequency: Q3H PRN     Dose: 2 mg     Route: Intravenous    NS premix infusion     Frequency: Continuous     Route: Intravenous    ondansetron  (ZOFRAN ) 2 mg/mL injection  Frequency: Q6H PRN     Dose: 4 mg     Route: Intravenous    pantoprazole  (PROTONIX ) delayed release tablet     Frequency: Daily     Dose: 40 mg     Route: Oral    pravastatin  (PRAVACHOL ) tablet      Frequency: QPM     Dose: 80 mg     Route: Oral    prochlorperazine  (COMPAZINE ) 5 mg/mL injection     Frequency: Q6H PRN     Dose: 10 mg     Route: Intravenous    sertraline  (ZOLOFT ) tablet     Frequency: Daily     Dose: 100 mg     Route: Oral        Review of Systems:  Focused review of system was completed. Refer to the HPI for ROS details.     Today's Physical Exam:  Physical Exam  Vitals reviewed.   Constitutional:       Appearance: Normal appearance.   HENT:      Head: Normocephalic and atraumatic.      Mouth/Throat:      Mouth: Mucous membranes are moist.   Eyes:      Extraocular Movements: Extraocular movements intact.   Cardiovascular:      Rate and Rhythm: Normal rate and regular rhythm.      Heart sounds: No murmur heard.  Pulmonary:      Effort: Pulmonary effort is normal.      Breath sounds: Normal breath sounds.      Comments: Wearing O2 NC  Abdominal:      General: Abdomen is flat.      Palpations: Abdomen is soft.      Tenderness: There is no abdominal tenderness.   Musculoskeletal:         General: Tenderness (Right hip) present.      Cervical back: Normal range of motion and neck supple.   Skin:     General: Skin is warm and dry.   Neurological:      Mental Status: She is alert and oriented to person, place, and time.   Psychiatric:         Mood and Affect: Mood normal.         Behavior: Behavior normal.          I/O:  I/O last 24 hours:  No intake or output data in the 24 hours ending 09/19/23 1242  I/O current shift:  No intake/output data recorded.    Labs:  Reviewed: I have reviewed all lab results.  Lab Results Today:    Results for orders placed or performed during the hospital encounter of 09/18/23 (from the past 24 hours)   ECG 12 LEAD   Result Value Ref Range    Ventricular rate 79 BPM    Atrial Rate 79 BPM    PR Interval 198 ms    QRS Duration 84 ms    QT Interval 412 ms    QTC Calculation 472 ms    Calculated P Axis 73 degrees    Calculated R Axis 55 degrees    Calculated T Axis 24 degrees    COMPREHENSIVE METABOLIC PANEL, NON-FASTING   Result Value Ref Range    SODIUM 141 136 - 145 mmol/L    POTASSIUM 4.1 3.5 - 5.1 mmol/L    CHLORIDE 108 (H) 98 - 107 mmol/L    CO2 TOTAL 24 21 - 31 mmol/L    ANION GAP 9 4 - 13  mmol/L    BUN 28 (H) 7 - 25 mg/dL    CREATININE 8.97 9.39 - 1.30 mg/dL    BUN/CREA RATIO 27 (H) 6 - 22    ESTIMATED GFR 55 (L) >59 mL/min/1.19m^2    ALBUMIN 4.4 3.5 - 5.7 g/dL    CALCIUM 89.8 8.6 - 89.6 mg/dL    GLUCOSE 841 (H) 74 - 109 mg/dL    ALKALINE PHOSPHATASE 58 34 - 104 U/L    ALT (SGPT) 23 7 - 52 U/L    AST (SGOT) 25 13 - 39 U/L    BILIRUBIN TOTAL 0.6 0.3 - 1.0 mg/dL    PROTEIN TOTAL 7.5 6.4 - 8.9 g/dL    ALBUMIN/GLOBULIN RATIO 1.4 0.8 - 1.4    OSMOLALITY, CALCULATED 290 270 - 290 mOsm/kg    CALCIUM, CORRECTED 9.8 8.9 - 10.8 mg/dL    GLOBULIN 3.1 2.0 - 3.5   PT/INR   Result Value Ref Range    PROTHROMBIN TIME 11.7 9.8 - 12.7 seconds    INR 1.04 0.84 - 1.10   PTT (PARTIAL THROMBOPLASTIN TIME)   Result Value Ref Range    APTT 29.2 25.0 - 38.0 seconds   TYPE AND SCREEN   Result Value Ref Range    UNITS ORDERED NOT STATED     ABO/RH(D) A NEGATIVE     ANTIBODY SCREEN NEGATIVE     SPECIMEN EXPIRATION DATE 09/21/2023,2359    CBC WITH DIFF   Result Value Ref Range    WBC 8.0 3.8 - 11.8 x10^3/uL    RBC 4.34 3.63 - 4.92 x10^6/uL    HGB 13.5 10.9 - 14.3 g/dL    HCT 61.6 68.7 - 58.0 %    MCV 88.3 75.5 - 95.3 fL    MCH 31.2 24.7 - 32.8 pg    MCHC 35.4 32.3 - 35.6 g/dL    RDW 87.1 87.6 - 82.2 %    PLATELETS 136 (L) 140 - 440 x10^3/uL    MPV 7.2 (L) 7.9 - 10.8 fL    NEUTROPHIL % 80 (H) 43 - 77 %    LYMPHOCYTE % 12 (L) 16 - 46 %    MONOCYTE % 6 4 - 11 %    EOSINOPHIL % 2 1 - 7 %    BASOPHIL % 0 0 - 1 %    NEUTROPHIL # 6.50 1.90 - 8.20 x10^3/uL    LYMPHOCYTE # 1.00 (L) 1.10 - 3.10 x10^3/uL    MONOCYTE # 0.40 0.20 - 0.90 x10^3/uL    EOSINOPHIL # 0.10 0.00 - 0.50 x10^3/uL    BASOPHIL # 0.00 0.00 - 0.10 x10^3/uL   COMPREHENSIVE METABOLIC PANEL, NON-FASTING   Result Value Ref Range    SODIUM 139 136 - 145  mmol/L    POTASSIUM 4.1 3.5 - 5.1 mmol/L    CHLORIDE 106 98 - 107 mmol/L    CO2 TOTAL 25 21 - 31 mmol/L    ANION GAP 8 4 - 13 mmol/L    BUN 23 7 - 25 mg/dL    CREATININE 9.09 9.39 - 1.30 mg/dL    BUN/CREA RATIO 26 (H) 6 - 22    ESTIMATED GFR 63 >59 mL/min/1.46m^2    ALBUMIN 4.5 3.5 - 5.7 g/dL    CALCIUM 9.8 8.6 - 89.6 mg/dL    GLUCOSE 839 (H) 74 - 109 mg/dL    ALKALINE PHOSPHATASE 63 34 - 104 U/L    ALT (SGPT) 25 7 - 52 U/L    AST (SGOT) 26 13 -  39 U/L    BILIRUBIN TOTAL 0.8 0.3 - 1.0 mg/dL    PROTEIN TOTAL 7.5 6.4 - 8.9 g/dL    ALBUMIN/GLOBULIN RATIO 1.5 (H) 0.8 - 1.4    OSMOLALITY, CALCULATED 285 270 - 290 mOsm/kg    CALCIUM, CORRECTED 9.4 8.9 - 10.8 mg/dL    GLOBULIN 3.0 2.0 - 3.5   MAGNESIUM    Result Value Ref Range    MAGNESIUM  1.9 1.9 - 2.7 mg/dL   CBC WITH DIFF   Result Value Ref Range    WBC 9.5 3.8 - 11.8 x10^3/uL    RBC 4.33 3.63 - 4.92 x10^6/uL    HGB 13.7 10.9 - 14.3 g/dL    HCT 61.5 68.7 - 58.0 %    MCV 88.6 75.5 - 95.3 fL    MCH 31.6 24.7 - 32.8 pg    MCHC 35.7 (H) 32.3 - 35.6 g/dL    RDW 86.8 87.6 - 82.2 %    PLATELETS 131 (L) 140 - 440 x10^3/uL    MPV 7.2 (L) 7.9 - 10.8 fL    NEUTROPHIL % 86 (H) 43 - 77 %    LYMPHOCYTE % 8 (L) 16 - 46 %    MONOCYTE % 5 4 - 11 %    EOSINOPHIL % 0 (L) 1 - 7 %    BASOPHIL % 0 0 - 1 %    NEUTROPHIL # 8.20 1.90 - 8.20 x10^3/uL    LYMPHOCYTE # 0.80 (L) 1.10 - 3.10 x10^3/uL    MONOCYTE # 0.50 0.20 - 0.90 x10^3/uL    EOSINOPHIL # 0.00 0.00 - 0.50 x10^3/uL    BASOPHIL # 0.00 0.00 - 0.10 x10^3/uL     Micro Results: No results found for any visits on 09/18/23 (from the past 24 hours).  Images:   No results found.   XR HIP RIGHT W PELVIS 2-3 VIEWS   Final Result   Acute displaced right femoral neck fracture                      Radiologist location ID: [id number]         XR AP MOBILE CHEST   Final Result   NEGATIVE CHEST            Radiologist location ID: [id number]              Problem List:  Active Hospital Problems   (*Primary Problem)    Diagnosis    *Fracture of right  hip    Depression    Chronic kidney disease, stage 3 unspecified         Chronic obstructive pulmonary disease, unspecified         Essential hypertension    Type 2 diabetes mellitus         Mixed hyperlipidemia       Nutrition:   DIET NPO - NOW             Additional clinical characteristics related to nutrition:    - monitor for weight changes   - monitor intake and output    - monitor bowel functions        Lab Results   Component Value Date    ALBUMIN 4.5 09/19/2023     Assessment/ Plan:   Fracture of the right hip  -ortho consult, surgery planned for today  -pain control  2. Depression/Anxiety  -home medications  3. HTN  -home meds  4. GERD   -protonix   5. Iron  deficiency  -  home oral iron  replacement  6. Hyperlipidemia  -home pravastatin   7. Type 2 DM  -SSI       Hospitalist personally evaluated and examined the patient in conjunction with the MLP and agree with the assessments, treatment plan and disposition of the patient as recorded by the [facility].     DVT/PE Prophylaxis: Heparin     Discharge Planning: tbd    Recardo Hedger, PA

## 2023-09-19 NOTE — Nurses Notes (Signed)
 Bedside hand off given to: AMY PILKINS RN  Vitals Signs as follows: Temp: 98.0 F                                        HR: 108                                        Resp: 20                                        BP: 135/70                                        O2: 95% 3LPM NC  Dressing Status: R HIP CDI

## 2023-09-19 NOTE — Anesthesia Postprocedure Evaluation (Signed)
 Anesthesia Post Op Evaluation    Patient: Karen Chase  Procedure(s):  OPEN REDUCTION INTERNAL FIXATION RIGHT HIP FRACTURE USING BIPLOAR IMPLANTS    Last Vitals:Temperature: 36.4 C (97.6 F) (09/19/23 1625)  Heart Rate: (!) 104 (09/19/23 1625)  BP (Non-Invasive): (!) 158/75 (09/19/23 1625)  Respiratory Rate: 18 (09/19/23 1625)  SpO2: 95 % (09/19/23 1625)    No notable events documented.    Patient is sufficiently recovered from the effects of anesthesia to participate in the evaluation and has returned to their pre-procedure level.  Patient location during evaluation: PACU       Patient participation: complete - patient participated  Level of consciousness: awake and alert  Multimodal Pain Management: Multimodal analgesia used between 6 hours prior to anesthesia start to PACU discharge  Pain management: adequate  Airway patency: patent    Anesthetic complications: no  Cardiovascular status: acceptable  Respiratory status: acceptable  Hydration status: acceptable  Patient post-procedure temperature: Pt Normothermic   PONV Status: Absent

## 2023-09-19 NOTE — OR Surgeon (Signed)
 [facility]   Operative Note   PATIENT NAME:  Karen Chase, Karen Chase  MRN:  [id number]  DOB:  31-Mar-1940    Date of Procedure:  09/18/2023 - 09/19/2023  Preoperative Diagnosis: RIGHT HIP FRACTURE   Postoperative Diagnoses:  RIGHT HIP FRACTURE   Procedure Performed: Procedure(s) (LRB):  OPEN REDUCTION INTERNAL FIXATION RIGHT HIP FRACTURE USING BIPLOAR IMPLANTS (Right)    Surgeon: Gilmore Search, DO   Anesthesia: General    First Assist: Charlies Blazer  FA Function: Superficial closure  Estimated Blood Loss: Minimal  Complications: None immediate  Description of Procedure patient taken to the operating room given a general anesthetic placed in a left lateral decubitus position with the right hip up appropriate padding placed hip prepped with DuraPrep draped in a sterile manner.  Posterior approach was utilized femoral neck osteotomy performed head and neck excised measured 46 mm.  We removed all bony fragments copiously irrigated the acetabulum and reamed broached the femoral canal up to a 6 insignia stem which was impacted at the appropriate anteversion with excellent torsional stability in length.  Series femoral head trials performed with 28 mm standard neck length head snapped inside a 46 mm outer diameter bipolar shell the assembly tapped on a clean dry trunnion hip reduced and stable.  The capsule closed with 5. Ethibond suture through bone holes in the posterior trochanter fascia with 1. Vicryl in an interrupted figure-of-eight manner skin in layers a sterile bandage applied patient rolled supine awoken from anesthesia and brought to recovery in stable condition.  Gilmore Search, DO   This note was partially generated using MModal Fluency Direct system, and there may be some incorrect words, spellings, and punctuation that were not noted in checking the note before saving.

## 2023-09-19 NOTE — Anesthesia Procedure Notes (Signed)
 [facility]el[facility] CHRISTELLA [facility]    Airway Note  Biochemist, clinical provider: El Holbrook, Briny Breezes, DO  Performing provider: Naomi Pines, CRNA        Reason: elective    Airway not difficult    Indications and Patient Condition  Pt location: In Or  Indications for airway management: anesthesia  Sedation level: deep      Preoxygenated: yesPatient position: sniffing    Mask difficulty assessment: 1 - vent by mask        Final Airway Details    Final airway type: endotracheal airway        Successful airway: ETT   Successful intubation technique: direct laryngoscopy              Blade: Macintosh  Blade size: #3  Airway size (mm): 7.0  Cormack-Lehane Classification: grade I - full view of glottis  Placement verified by: chest auscultation and capnometry   Marked at 22  Measured from: lips  Secured with: Tape  Number of attempts at approach: 1Airway complications: Atraumatic  Additional Comments  Teeth as preop

## 2023-09-19 NOTE — Consults (Signed)
  MEDICINE [facility]  Orthopedic History and Physical    Patient Name: Karen Chase   MRN: [id number]   Date of Birth: [birth year]  Date: 09/19/2023   Age: 83 y.o.    Gender: female  Attending Physician: Arvil Copping, MD     Chief Complaint:  Subcapital fracture right hip     Subjective:    HPI:  Karen Chase is a 83 y.o. female who presents to [facility] after a fall direct blow of the right hip and subsequent fracture.  Was admitted to the hospitalist service orthopedic consultation obtained for definitive measure.  Patient admits significant pain of the right hip inability to ambulate and no associated injury at the time.    REVIEW OF SYSTEMS  General: No fatigue, fevers, chills  Ear, nose and throat: No dysphagia or nosebleeds.   Eyes: No change in vision.   Cardiovascular: No chest pain, dyspnea on exertion. No abnormal heartbeat.    Lung: No cough or shortness of breath.   Skin: No rash.   Neurological: No seizures, tremors or headaches.   Psychiatric: No hallucinations or psychosis     Past Medical History:   Diagnosis Date    Chronic obstructive pulmonary disease, unspecified 10/31/2022    Esophageal reflux     controlled with med    History of tuberculosis     as teenager    Hyperlipidemia     Hypertension     Type 2 diabetes mellitus            Past Surgical History:   Procedure Laterality Date    COLONOSCOPY      HAND SURGERY Right     HX HYSTERECTOMY      HX LAP CHOLECYSTECTOMY             @MEDSSCHEDULED @     Allergies[1]    Social History     Socioeconomic History    Marital status: Married     Spouse name: Not on file    Number of children: Not on file    Years of education: Not on file    Highest education level: Not on file   Occupational History    Not on file   Tobacco Use    Smoking status: Never    Smokeless tobacco: Never   Substance and Sexual Activity    Alcohol  use: Not Currently    Drug use: Not Currently    Sexual activity: Not on file   Other Topics Concern     Ability to Walk 1 Flight of Steps without SOB/CP Yes    Routine Exercise Not Asked    Ability to Walk 2 Flight of Steps without SOB/CP Yes    Unable to Ambulate Not Asked    Total Care Not Asked    Ability To Do Own ADL's Yes    Uses Walker Not Asked    Other Activity Level Not Asked    Uses Cane Not Asked   Social History Narrative    Not on file     Social Determinants of Health     Financial Resource Strain: Not on file   Transportation Needs: No Transportation Needs (07/27/2021)    Received from [facility]    OASIS A1250: Transportation     Lack of Transportation (Medical): No     Lack of Transportation (Non-Medical): No     Patient Unable or Declines to Respond: No   Social Connections: Low Risk  (09/19/2023)  Social Connections     SDOH Social Isolation: 5 or more times a week   Intimate Partner Violence: Not on file   Housing Stability: Not on file       Objective:    Last Filed Vitals:  Filed Vitals:    09/19/23 0830 09/19/23 1038 09/19/23 1054 09/19/23 1143   BP:  (!) 160/90     Pulse:  78 81    Resp: 17 18  17    Temp:  36.6 C (97.9 F)     SpO2:  97%        Body mass index is 28.51 kg/m.   Cognitive alert    Constitutional: Appears comfortable, no acute distress.  Alert and oriented x 2 .  Cardiovascular: Regular rate, no peripheral edema.  Respiratory: Normal respiratory effort  Head: NCAT    Musculoskeletal Exam:   Shortening external rotation right hip consistent with right hip fracture distal CMS intact without calf pain.    Radiographs:   @RADRESULTS2DAYS @  Garden 4 subcapital fracture right hip.     BMP:   139 (07/23 0527) 106 (07/23 0527) 23 (07/23 0527)    /     160* (07/23 0527)   4.1 (07/23 0527) 25 (07/23 0527) 0.90 (07/23 0527) \              CBC:     9.5 (07/23 0527) \   13.7 (07/23 0527) /   131* (07/23 0527)      / 38.4 (07/23 0527) \             Lab Data   Results from last 7 days   Lab Units 09/19/23  0527   CO2 mmol/L 25   BUN mg/dL 23   CREA mg/dL 9.09   CA mg/dL 9.8   ALKP U/L 63    SGPT U/L 25   SGOT U/L 26     Lab Results   Component Value Date    WBC 9.5 09/19/2023    HGB 13.7 09/19/2023    HCT 38.4 09/19/2023     Lab Results   Component Value Date    APTT 29.2 09/18/2023    INR 1.04 09/18/2023     Lab Results   Component Value Date    TROPONINI 4 05/06/2021     No results found for: HGBA1C    Microbiology:      INFLUENZA VIRUS TYPE A   Date Value Ref Range Status   04/14/2019 Not Detected Not Detected Final     INFLUENZA VIRUS TYPE B   Date Value Ref Range Status   04/14/2019 Not Detected Not Detected Final           Impression and Plan:    Impression:   Garden 4 subcapital fracture right hip     Plan:  Right bipolar hip arthroplasty after medical clearance.  Lengthy discussion with the patient and her brother today who was with her concerning risks benefits and complications of all treatment surgical informed consent was obtained.         [1]   Allergies  Allergen Reactions    Sulfa (Sulfonamides) Hives/ Urticaria

## 2023-09-19 NOTE — Care Plan (Signed)
 Problem: Adult Inpatient Plan of Care  Goal: Plan of Care Review  Outcome: Ongoing (see interventions/notes)  Goal: Patient-Specific Goal (Individualized)  Outcome: Ongoing (see interventions/notes)  Flowsheets (Taken 09/19/2023 0212)  Individualized Care Needs: CHG bath, pain control  Anxieties, Fears or Concerns: having surgery today  Goal: Absence of Hospital-Acquired Illness or Injury  Outcome: Ongoing (see interventions/notes)  Intervention: Identify and Manage Fall Risk  Recent Flowsheet Documentation  Taken 09/19/2023 0400 by Champ GRADE, RN  Safety Promotion/Fall Prevention:   fall prevention program maintained   nonskid shoes/slippers when out of bed   safety round/check completed  Taken 09/19/2023 0212 by Champ GRADE, RN  Safety Promotion/Fall Prevention:   fall prevention program maintained   nonskid shoes/slippers when out of bed   safety round/check completed  Intervention: Prevent Skin Injury  Recent Flowsheet Documentation  Taken 09/19/2023 0212 by Champ GRADE, RN  Body Position: supine, head elevated  Skin Protection:   adhesive use limited   incontinence pads utilized   pouching devices used  Intervention: Prevent Infection  Recent Flowsheet Documentation  Taken 09/19/2023 0400 by Champ GRADE, RN  Infection Prevention:   promote handwashing   rest/sleep promoted   single patient room provided  Taken 09/19/2023 0212 by Champ GRADE, RN  Infection Prevention:   promote handwashing   rest/sleep promoted   single patient room provided  Goal: Optimal Comfort and Wellbeing  Outcome: Ongoing (see interventions/notes)  Intervention: Provide Person-Centered Care  Recent Flowsheet Documentation  Taken 09/19/2023 0212 by Champ GRADE, RN  Trust Relationship/Rapport: care explained  Goal: Rounds/Family Conference  Outcome: Ongoing (see interventions/notes)     Problem: Health Knowledge, Opportunity to Enhance (Adult,Obstetrics,Pediatric)  Goal: Knowledgeable about Health Subject/Topic  Description: Patient will demonstrate the desired  outcomes by discharge/transition of care.  Outcome: Ongoing (see interventions/notes)     Problem: Skin Injury Risk Increased  Goal: Skin Health and Integrity  Outcome: Ongoing (see interventions/notes)  Intervention: Optimize Skin Protection  Recent Flowsheet Documentation  Taken 09/19/2023 0212 by Champ GRADE, RN  Skin Protection:   adhesive use limited   incontinence pads utilized   pouching devices used  Activity Management: bedrest  Head of Bed (HOB) Positioning: HOB at 20 degrees     Problem: Fall Injury Risk  Goal: Absence of Fall and Fall-Related Injury  Outcome: Ongoing (see interventions/notes)  Intervention: Promote Scientist, clinical (histocompatibility and immunogenetics) Documentation  Taken 09/19/2023 0400 by Champ GRADE, RN  Safety Promotion/Fall Prevention:   fall prevention program maintained   nonskid shoes/slippers when out of bed   safety round/check completed  Taken 09/19/2023 0212 by Champ GRADE, RN  Safety Promotion/Fall Prevention:   fall prevention program maintained   nonskid shoes/slippers when out of bed   safety round/check completed     Problem: Orthopaedic Fracture  Goal: Absence of Bleeding  Outcome: Ongoing (see interventions/notes)  Goal: Bowel Elimination  Outcome: Ongoing (see interventions/notes)  Goal: Absence of Embolism Signs and Symptoms  Outcome: Ongoing (see interventions/notes)  Goal: Fracture Stability  Outcome: Ongoing (see interventions/notes)  Goal: Optimal Functional Ability  Outcome: Ongoing (see interventions/notes)  Intervention: Optimize Functional Ability  Recent Flowsheet Documentation  Taken 09/19/2023 0212 by Champ GRADE, RN  Activity Management: bedrest  Positioning/Transfer Devices: transfer board  Goal: Absence of Infection Signs and Symptoms  Outcome: Ongoing (see interventions/notes)  Goal: Effective Tissue Perfusion  Outcome: Ongoing (see interventions/notes)  Goal: Optimal Pain Control and Function  Outcome: Ongoing (see interventions/notes)  Goal: Effective Oxygenation and  Ventilation  Outcome: Ongoing (  see interventions/notes)  Intervention: Promote Airway Secretion Clearance  Recent Flowsheet Documentation  Taken 09/19/2023 0212 by Champ GRADE, RN  Activity Management: bedrest  Intervention: Optimize Oxygenation and Ventilation  Recent Flowsheet Documentation  Taken 09/19/2023 0212 by Champ GRADE, RN  Head of Bed [facility]) Positioning: HOB at 20 degrees

## 2023-09-19 NOTE — ED Nurses Note (Signed)
 Report given to Darla on 3-S at this time.

## 2023-09-20 ENCOUNTER — Encounter (HOSPITAL_COMMUNITY): Payer: Self-pay

## 2023-09-20 DIAGNOSIS — N189 Chronic kidney disease, unspecified: Secondary | ICD-10-CM

## 2023-09-20 DIAGNOSIS — K219 Gastro-esophageal reflux disease without esophagitis: Secondary | ICD-10-CM

## 2023-09-20 DIAGNOSIS — E785 Hyperlipidemia, unspecified: Secondary | ICD-10-CM

## 2023-09-20 DIAGNOSIS — Z794 Long term (current) use of insulin: Secondary | ICD-10-CM

## 2023-09-20 DIAGNOSIS — E611 Iron deficiency: Secondary | ICD-10-CM

## 2023-09-20 DIAGNOSIS — E1122 Type 2 diabetes mellitus with diabetic chronic kidney disease: Secondary | ICD-10-CM

## 2023-09-20 DIAGNOSIS — Z79899 Other long term (current) drug therapy: Secondary | ICD-10-CM

## 2023-09-20 LAB — BASIC METABOLIC PANEL
ANION GAP: 6 mmol/L (ref 4–13)
BUN/CREA RATIO: 19 (ref 6–22)
BUN: 20 mg/dL (ref 7–25)
CALCIUM: 9.3 mg/dL (ref 8.6–10.3)
CHLORIDE: 102 mmol/L (ref 98–107)
CO2 TOTAL: 25 mmol/L (ref 21–31)
CREATININE: 1.06 mg/dL (ref 0.60–1.30)
ESTIMATED GFR: 52 mL/min/1.73mˆ2 — ABNORMAL LOW (ref >59)
GLUCOSE: 126 mg/dL — ABNORMAL HIGH (ref 74–109)
OSMOLALITY, CALCULATED: 271 mosm/kg (ref 270–290)
POTASSIUM: 4.5 mmol/L (ref 3.5–5.1)
SODIUM: 133 mmol/L — ABNORMAL LOW (ref 136–145)

## 2023-09-20 LAB — CBC WITH DIFF
BASOPHIL #: 0 x10ˆ3/uL (ref 0.00–0.10)
BASOPHIL %: 0 % (ref 0–1)
EOSINOPHIL #: 0 x10ˆ3/uL (ref 0.00–0.50)
EOSINOPHIL %: 0 % — ABNORMAL LOW (ref 1–7)
HCT: 33.6 % (ref 31.2–41.9)
HGB: 11.9 g/dL (ref 10.9–14.3)
LYMPHOCYTE #: 0.6 x10ˆ3/uL — ABNORMAL LOW (ref 1.10–3.10)
LYMPHOCYTE %: 6 % — ABNORMAL LOW (ref 16–46)
MCH: 31.7 pg (ref 24.7–32.8)
MCHC: 35.5 g/dL (ref 32.3–35.6)
MCV: 89.2 fL (ref 75.5–95.3)
MONOCYTE #: 0.8 x10ˆ3/uL (ref 0.20–0.90)
MONOCYTE %: 8 % (ref 4–11)
MPV: 7.4 fL — ABNORMAL LOW (ref 7.9–10.8)
NEUTROPHIL #: 8.7 x10ˆ3/uL — ABNORMAL HIGH (ref 1.90–8.20)
NEUTROPHIL %: 85 % — ABNORMAL HIGH (ref 43–77)
PLATELETS: 129 x10ˆ3/uL — ABNORMAL LOW (ref 140–440)
RBC: 3.76 x10ˆ6/uL (ref 3.63–4.92)
RDW: 13 % (ref 12.3–17.7)
WBC: 10.2 x10ˆ3/uL (ref 3.8–11.8)

## 2023-09-20 LAB — POC BLOOD GLUCOSE (RESULTS)
GLUCOSE, POC: 126 mg/dL — ABNORMAL HIGH (ref 70–100)
GLUCOSE, POC: 113 mg/dL — ABNORMAL HIGH (ref 70–100)
GLUCOSE, POC: 175 mg/dL — ABNORMAL HIGH (ref 70–100)

## 2023-09-20 MED ORDER — METOPROLOL SUCCINATE ER 25 MG TABLET,EXTENDED RELEASE 24 HR
12.5000 mg | ORAL_TABLET | Freq: Two times a day (BID) | ORAL | Status: DC
Start: 2023-09-20 — End: 2023-09-21
  Administered 2023-09-20: 0 mg via ORAL
  Administered 2023-09-21 (×2): 12.5 mg via ORAL
  Filled 2023-09-20 (×3): qty 1

## 2023-09-20 MED ORDER — MAGIC MOUTHWASH
10.0000 mL | Freq: Three times a day (TID) | ORAL | Status: DC | PRN
Start: 2023-09-20 — End: 2023-09-24
  Administered 2023-09-20 (×2): 10 mL via ORAL
  Filled 2023-09-20: qty 180

## 2023-09-20 NOTE — Care Plan (Signed)
 Problem: Adult Inpatient Plan of Care  Goal: Plan of Care Review  Outcome: Ongoing (see interventions/notes)  Flowsheets (Taken 09/20/2023 0224)  Progress: no change  Plan of Care Reviewed With: patient  Goal: Patient-Specific Goal (Individualized)  Outcome: Ongoing (see interventions/notes)  Goal: Absence of Hospital-Acquired Illness or Injury  Outcome: Ongoing (see interventions/notes)  Goal: Optimal Comfort and Wellbeing  Outcome: Ongoing (see interventions/notes)  Goal: Rounds/Family Conference  Outcome: Ongoing (see interventions/notes)     Problem: Health Knowledge, Opportunity to Enhance (Adult,Obstetrics,Pediatric)  Goal: Knowledgeable about Health Subject/Topic  Description: Patient will demonstrate the desired outcomes by discharge/transition of care.  Outcome: Ongoing (see interventions/notes)     Problem: Skin Injury Risk Increased  Goal: Skin Health and Integrity  Outcome: Ongoing (see interventions/notes)     Problem: Fall Injury Risk  Goal: Absence of Fall and Fall-Related Injury  Outcome: Ongoing (see interventions/notes)     Problem: Orthopaedic Fracture  Goal: Absence of Bleeding  Outcome: Ongoing (see interventions/notes)  Goal: Bowel Elimination  Outcome: Ongoing (see interventions/notes)  Goal: Absence of Embolism Signs and Symptoms  Outcome: Ongoing (see interventions/notes)  Goal: Fracture Stability  Outcome: Ongoing (see interventions/notes)  Goal: Optimal Functional Ability  Outcome: Ongoing (see interventions/notes)  Goal: Absence of Infection Signs and Symptoms  Outcome: Ongoing (see interventions/notes)  Goal: Effective Tissue Perfusion  Outcome: Ongoing (see interventions/notes)  Goal: Optimal Pain Control and Function  Outcome: Ongoing (see interventions/notes)  Goal: Effective Oxygenation and Ventilation  Outcome: Ongoing (see interventions/notes)     Problem: Wound  Goal: Optimal Coping  Outcome: Ongoing (see interventions/notes)  Goal: Optimal Functional Ability  Outcome: Ongoing  (see interventions/notes)  Goal: Absence of Infection Signs and Symptoms  Outcome: Ongoing (see interventions/notes)  Goal: Improved Oral Intake  Outcome: Ongoing (see interventions/notes)  Goal: Optimal Pain Control and Function  Outcome: Ongoing (see interventions/notes)  Goal: Skin Health and Integrity  Outcome: Ongoing (see interventions/notes)  Goal: Optimal Wound Healing  Outcome: Ongoing (see interventions/notes)   Patient is post surgical repair of a hip fracture. Post surgical incision covered with dermabond and is clean dry in tacked. Patient encouraged not to cross leg and to keep rolled blanket in place. Care dry in use with urine collected in canister. 02 in use as needed. High fall risk with bed check in use. Pain medication given PRN. Discharge planning on going.

## 2023-09-20 NOTE — OT Evaluation (Signed)
 [facility]  [address]  [place], [address]  [phone]  (Fax) [phone]  Rehabilitation Services  Occupational Therapy Inpatient Initial Evaluation      Patient Name: Karen Chase  Date of Birth: [birth year]  Height: Height: 157.5 cm (5' 2)  Weight: Weight: 70.7 kg (155 lb 14.4 oz)  Room/Bed: 374/A  Payor: [employer] MEDICARE / Plan: [employer] MEDICARE HMO / Product Type: MEDICARE [facility] /         PMH:   Past Medical History:   Diagnosis Date    Chronic obstructive pulmonary disease, unspecified 10/31/2022    Esophageal reflux     controlled with med    History of tuberculosis     as teenager    Hyperlipidemia     Hypertension     Type 2 diabetes mellitus            Assessment:      Functional Level at Time of Session: (P) Patient is a pleasant 83 year old female admitted for right hip fracture. Prior to admission, she was independent in ADLs and functional mobility. During OT eval, patient was alert, oriented x4, cooperative and able to follow two step commands. She has full UB AROM and fair plus (3+/5) UB strength. She required SBA to transfer from supine-sit, min A to transfer from sit-stand-sit and mod A to transfer from [employer] chair. OT educated patient on use of ADL kit and patient was able to return demo with CGA. Patient will benefit from post-acute rehab upon discharge. OT to follow up during acute stay for ADL training, DME/AE training and functional transfer training.    Discharge Needs:   Equipment Recommendation:  reacher, sock aid, long handled sponge, long shoe horn     The patient presents with mobility limitations due to impaired range of motion, impaired strength, and impaired functional activity tolerance that significantly impair/prevent patient's ability to participate in mobility-related activities of daily living (MRADLs) including  ambulation and transfers in order to safely complete, toileting, bathing, safely entering/exiting the home, in  reasonable time. This functional mobility deficit can be sufficiently resolved with the use of a Anticipated Equipment Needs at Discharge: (P) bathing equipment, dressing equipment, reacher  in order to decrease the risk of falls, morbidity, and mortality in performance of these MRADLs.  Patient is able to safely use this assistive device.    Discharge Disposition:  inpatient rehabilitation facility, skilled nursing facility    JUSTIFICATION OF DISCHARGE RECOMMENDATION   Based on current diagnosis, functional performance prior to admission, and current functional performance, this patient requires continued OT services in Anticipated Discharge Disposition: (P) inpatient rehabilitation facility, skilled nursing facility  in order to achieve significant functional improvements.    Plan:   Current Intervention:  Predicted Duration of Therapy: (P) until discharge    To provide Occupational therapy services  Therapy Frequency: (P) minimum of 1x/week for duration of Predicted Duration of Therapy: (P) until discharge  .       The risks/benefits of therapy have been discussed with the patient/caregiver and he/she is in agreement with the established plan of care.       Subjective & Objective     MEDICAL HISTORY:   Past Medical History:   Diagnosis Date    Chronic obstructive pulmonary disease, unspecified 10/31/2022    Esophageal reflux     controlled with med    History of tuberculosis     as teenager    Hyperlipidemia  Hypertension     Type 2 diabetes mellitus          SURGICAL HISTORY:   Past Surgical History:   Procedure Laterality Date    COLONOSCOPY      HAND SURGERY Right     HX HYSTERECTOMY      HX LAP CHOLECYSTECTOMY         ipp    INSERT FLOW SHEET     09/20/23 0843   Rehab Session   Document Type evaluation   OT Visit Date 09/20/23   Total OT Minutes: 37   Patient Effort good   General Information   Patient Profile Reviewed yes   Medical Lines PIV Line;Telemetry   Respiratory Status nasal cannula   Existing  Precautions/Restrictions posterior hip precautions   Pre Treatment Status   Pre Treatment Patient Status Patient supine in bed;Call light within reach;Telephone within reach;Patient safety alarm activated;Nurse approved session   Support Present Pre Treatment  None   Communication Pre Treatment  Charge Nurse   Communication Pre Treatment Comment Cleared for OT   Mutuality/Individual Preferences   Anxieties, Fears or Concerns RLE pain   Individualized Care Needs Assistance with ADLs, monitoring vitals   Patient-Specific Goals (Include Timeframe) Pain control   Plan of Care Reviewed With patient   Living Environment   Lives With spouse   Living Arrangements house   Home Accessibility tub/shower is not walk in   Living Environment Comment She has a tub and BSC   Functional Level Prior   Ambulation 0 - independent   Transferring 0 - independent   Toileting 1 - assistive equipment  (BSC)   Bathing 0 - independent   Dressing 0 - independent   Eating 0 - independent   Communication 0 - understands/communicates without difficulty   Swallowing 0-->swallows foods/liquids without difficulty   Self-Care   Dominant Hand right   Vital Signs   Pre-Treatment Heart Rate (beats/min) 74   Post-treatment Heart Rate (beats/min) 75   Pre SpO2 (%) 98   O2 Delivery Pre Treatment supplemental O2   O2 Liters Pre Treatment via nasal canula   Post SpO2 (%) 97   O2 Delivery Post Treatment supplemental O2   O2 Liters Post Treatment via nasal canula   Coping/Psychosocial   Observed Emotional State calm;cooperative   Verbalized Emotional State acceptance   Cognition   Behavior/Mood Observations alert;cooperative;confused   Orientation Status oriented x 4   Attention mild impairment   Follows Commands follows two step commands   Vision Assessment/Interventions   Visual Impairment/Limitations WFL with corrective lenses   RUE Assessment   RUE Assessment WFL- Within Functional Limits   LUE Assessment   LUE Assessment WFL- Within Functional Limits    Grip Strength   Grip Left (3+/5) fair plus, left   Right Grip (3+/5) fair plus, right   Bed Mobility   Supine-Sit Independence stand-by assistance   Transfer Assessment/Treatment   Sit-Stand Independence minimum assist (75% patient effort)   Stand-Sit Independence minimum assist (75% patient effort)   Sit-Stand-Sit, Assist Device walker, front wheeled   Bed-Chair Independence moderate assist (50% patient effort)   Bed-Chair-Bed Assist Device walker, front wheeled   Transfer Impairments pain;endurance;ROM decreased;strength decreased   Lower Body Dressing Assessment/Training   Assistive Devices reacher;sock-aid   Position sitting   DRESSING ASSESSED Don Socks;Doff Socks   Independence Level  contact guard assist   Impairments pain;ROM decreased   Post Treatment Status   Post Treatment Patient Status Patient sitting in bedside  chair or w/c;Call light within reach;Telephone within reach;Patient safety alarm activated   Support Present Post Treatment  Family present   Film/video editor Nurse   Care Plan Goals   OT Rehab Goals Bathing Goal;Grooming Goal;LB Dressing Goal;Toileting Goal;UB Dressing Goal   Bathing Goal   Bathing Goal, Date Established 09/20/23   Bathing Goal, Time to Achieve by discharge   Bathing Goal, Activity Type all bathing tasks   Bathing Goal, Independence Level contact guard assist   Bathing Goal, Adaptive Equipment shower chair;sponge, long handled   Grooming Goal   Grooming Goal, Date Established 09/20/23   Grooming Goal, Time to Achieve by discharge   Grooming Goal, Activity Type all grooming tasks   Grooming Goal, Independence  contact guard assist   LB Dressing Goal   LB Dressing Goal, Date Established 09/20/23   LB Dressing Goal, Time to Achieve by discharge   LB Dressing Goal, Activity Type all lower body dressing tasks   LB Dressing Goal, Independence Level stand-by assistance   LB Dressing Goal, Adaptive Equipment reacher;shoe horn, long handled;sock-aid   Toileting Goal    Toileting Goal, Date Established 09/20/23   Toileting Goal, Time to Achieve by discharge   Toileting Goal, Activity Type all toileting tasks   Toileting Goal, Independence Level contact guard assist   UB Dressing Goal   UB Dressing  Goal, Date Established 09/20/23   UB Dressing Goal, Time to Achieve by discharge   UB Dressing Goal, Activity Type all upper body dressing tasks   UB Dressing Goal, Independence Level stand-by assistance   Planned Therapy Interventions, OT Eval   Planned Therapy Interventions ADL retraining;ROM (range of motion);strengthening   Clinical Impression   Functional Level at Time of Session Patient is a pleasant 83 year old female admitted for right hip fracture. Prior to admission, she was independent in ADLs and functional mobility. During OT eval, patient was alert, oriented x4, cooperative and able to follow two step commands. She has full UB AROM and fair plus (3+/5) UB strength. She required SBA to transfer from supine-sit, min A to transfer from sit-stand-sit and mod A to transfer from [employer] chair. OT educated patient on use of ADL kit and patient was able to return demo with CGA. Patient will benefit from post-acute rehab upon discharge. OT to follow up during acute stay for ADL training, DME/AE training and functional transfer training.   Criteria for Skilled Therapeutic Interventions Met (OT) yes;meets criteria;skilled treatment is necessary   Rehab Potential good   Therapy Frequency minimum of 1x/week   Predicted Duration of Therapy until discharge   Anticipated Equipment Needs at Discharge bathing equipment;dressing equipment;reacher   Anticipated Discharge Disposition inpatient rehabilitation facility;skilled nursing facility   Evaluation Complexity Justification   Occupational Profile Review Brief history   Performance Deficits 1-3 deficits   Clinical Decision Making Low analytic complexity   Evaluation Complexity Low       TREATMENT PLAN: THERAPEUTIC EXERCISES,  THERAPEUTIC ACTIVITIES, ADL/IADL TRAINING, and DME/AE TRAINING  EVALUATION COMPLEXITY: CLINICAL DECISION MAKING OF LOW COMPLEXITY AS INDICATED BY PMH, OCCUPATIONAL THERAPY ASSESSMENT OF MUSCULOSKELETAL AND NEUROLOGICAL SYSTEMS AND ACTIVITY LIMITATIONS. CLINICAL PRESENTATION IS STABLE AND UNCOMPLICATED.      EVALUATION 13 minutes and ADL/IADL TRAINING 24 minutes    Therapist:      Rankin Coolman, OT,09/20/2023 11:32

## 2023-09-20 NOTE — Nurses Notes (Addendum)
 Patient c/o of sore throat, and blisters in mouth. Messaged Dr Edrie via secure chat. Awaiting orders. Will continue to monitor.     Given magic mouthwash ordered prn. Will continue to monitor patient.

## 2023-09-20 NOTE — Progress Notes (Signed)
 [facility]               IP PROGRESS NOTE      [name], [name]  Date of Admission:  09/18/2023  Date of Birth:  06-Aug-1940  Date of Service:  09/20/2023    Hospital Day:  LOS: 2 days     Subjective:   Karen Chase is a 83 y.o. female with a known past medical history of Chronic Obstructive Pulmonary Disease, chronic kidney disease, hypertension, diabetes mellitus, and CAD presented to the ED with right hip pain after mechanical fall over door threshold. Patient was found to have right femoral neck fracture.  Orthopedics was consulted from the emergency department, agreeable to follow. Patient does normally follow with Dr.Qazi for cardiac problems.  No history of echo in our EMR.      09/19/23 - pt seen and examined at bedside. Home meds reviewed. Notes pain to the right leg and hip. Surgery scheduled for today. Not other complaints at this time.    09/20/23 - pt seen and examined at bedside. States pain is better since surgery yesterday, controlled at this time. OT at bedside. PT to evaluate.     Vital Signs:  Temp (24hrs) Max:37.1 C (98.7 F)      Temperature: 37.1 C (98.7 F)  BP (Non-Invasive): (!) 160/75  MAP (Non-Invasive): 101 mmHG  Heart Rate: 78  Respiratory Rate: 17  SpO2: 100 %    Current Medications:  acetaminophen  (TYLENOL ) tablet, 650 mg, Oral, Q4H PRN  alcohol  62 % (NOZIN NASAL SANITIZER) nasal swab packet, 1 Each, Each Nostril, 2x/day  aspirin  chewable tablet 81 mg, 81 mg, Oral, 2x/day  bisacodyl  (DULCOLAX) rectal suppository, 10 mg, Rectal, Daily PRN  cholecalciferol  (VITAMIN D3) 1000 unit (25 mcg) tablet, 1,000 Units, Oral, Daily  Correction/SSIP insulin  lispro 100 units/mL injection, 2-9 Units, Subcutaneous, 4x/day AC  dextrose  (GLUTOSE) 40% oral gel, 15 g, Oral, Q15 Min PRN  dextrose  50% (0.5 g/mL) injection - syringe, 12.5 g, Intravenous, Q15 Min PRN  dilTIAZem  (CARDIZEM  CD) 24 hr extended release capsule, 240 mg, Oral, Daily  diphenhydrAMINE  (BENADRYL ) 50 mg/mL injection,  12.5 mg, Intravenous, Q6H PRN  docusate sodium  (COLACE) capsule, 100 mg, Oral, 2x/day  donepezil  (ARICEPT ) tablet, 10 mg, Oral, Daily  famotidine  (PEPCID ) tablet, 20 mg, Oral, 2x/day  ferrous sulfate  324 mg (65 mg elemental IRON ) tablet, 324 mg, Oral, Daily before Breakfast  glucagon  injection 1 mg, 1 mg, IntraMUSCULAR, Once PRN  heparin  5,000 unit/mL injection, 5,000 Units, Subcutaneous, Q8HRS  HYDROmorphone  (DILAUDID ) 2 mg/mL injection, 0.2 mg, Intravenous, Q4H PRN  HYDROmorphone  (DILAUDID ) 2 mg/mL injection, 0.2 mg, Intravenous, Q4H PRN  ipratropium-albuterol  0.5 mg-3 mg(2.5 mg base)/3 mL Solution for Nebulization, 3 mL, Nebulization, Q4H PRN  ketorolac  (TORADOL ) 30 mg/mL injection, 15 mg, Intravenous, Q8HRS  LORazepam  (ATIVAN ) tablet, 1 mg, Oral, 3x/day  losartan  (COZAAR ) tablet, 25 mg, Oral, Daily  LR premix infusion, , Intravenous, Continuous  Magic Mouthwash, 10 mL, Swish & Spit, 3x/day PRN  metoprolol  succinate (TOPROL -XL) 24 hr extended release tablet, 12.5 mg, Oral, 2x/day  montelukast  (SINGULAIR ) 10 mg tablet, 10 mg, Oral, Daily  morphine  2 mg/mL injection, 2 mg, Intravenous, Q3H PRN  morphine  2 mg/mL injection, 2 mg, Intravenous, Q2H PRN  multivitamin (THERA) tablet, 1 Tablet, Oral, Daily  naloxone  (NARCAN ) 0.4 mg/mL injection, 0.4 mg, Intravenous, Q2 MIN PRN  ondansetron  (ZOFRAN ) 2 mg/mL injection, 4 mg, Intravenous, Q4H PRN  oxyCODONE -acetaminophen  (PERCOCET) 5-325mg  per tablet, 1 Tablet, Oral, Q4H PRN  oxyCODONE -acetaminophen  (  PERCOCET) 5-325mg  per tablet, 2 Tablet, Oral, Q4H PRN  pantoprazole  (PROTONIX ) delayed release tablet, 40 mg, Oral, Daily  pravastatin  (PRAVACHOL ) tablet, 80 mg, Oral, QPM  prochlorperazine  (COMPAZINE ) 5 mg/mL injection, 10 mg, Intravenous, Q6H PRN  sertraline  (ZOLOFT ) tablet, 100 mg, Oral, Daily  traMADol  (ULTRAM ) tablet, 50 mg, Oral, Q6HRS        Current Orders:  Active Orders   Lab    URINALYSIS, MACROSCOPIC     Frequency: Once     Number of Occurrences: 1 Occurrences     URINALYSIS, MACROSCOPIC AND MICROSCOPIC W/CULTURE REFLEX [PRN ONLY]     Frequency: ONE TIME     Number of Occurrences: 1 Occurrences    URINALYSIS, MICROSCOPIC     Frequency: Once     Number of Occurrences: 1 Occurrences   Diet    DIET REGULAR Do you want to initiate MNT Protocol? Yes     Frequency: All Meals     Number of Occurrences: 1 Occurrences   Nursing    ACTIVITY     Frequency: UNTIL DISCONTINUED     Number of Occurrences: Until Specified    BLADDER SCAN IF PATIENT HAS NOT VOIDED WITHIN 4 HOURS     Frequency: Q4H PRN     Number of Occurrences: Until Specified    CHANGE OUTER DRESSING ONLY     Frequency: PRN     Number of Occurrences: Until Specified    COLD THERAPY     Frequency: UNTIL DISCONTINUED     Number of Occurrences: Until Specified    ENCOURAGE FREQUENT ANKLE/FOOT MOTION Q1HR WHILE AWAKE     Frequency: Q1H     Number of Occurrences: Until Specified    HEELS OFF BED/UTILIZE HEEL RELIEF FUNCTION ON BED     Frequency: CONTINUOUS     Number of Occurrences: Until Specified    HYPOGLYCEMIA MANAGEMENT - CONSCIOUS PATIENT W/DIET ORDER     Frequency: UNTIL DISCONTINUED     Number of Occurrences: Until Specified    HYPOGLYCEMIA MANAGEMENT - UNCONSCIOUS/ALTERED/NPO PATIENT     Frequency: UNTIL DISCONTINUED     Number of Occurrences: Until Specified    HYPOGLYCEMIA TREATMENT ALGORITHM     Frequency: UNTIL DISCONTINUED     Number of Occurrences: Until Specified    INCENTIVE SPIROMETRY NURSING     Frequency: Q1H WA     Number of Occurrences: 250 Occurrences    INTAKE AND OUTPUT QSHIFT     Frequency: QSHIFT     Number of Occurrences: Until Specified    INTAKE AND OUTPUT QSHIFT     Frequency: QSHIFT     Number of Occurrences: Until Specified    KEEP FOOT OF BED FLAT.  DO NOT PUT PILLOW UNDER KNEE     Frequency: UNTIL DISCONTINUED     Number of Occurrences: Until Specified    Notify MD Vital Signs     Frequency: PRN     Number of Occurrences: Until Specified    Notify MD Vital Signs     Frequency: PRN     Number of  Occurrences: Until Specified     Order Comments: Notify MD if urine output is less than 500 mL 8 hours after surgery.      NURSING DIET INFORMATION (NOT A DIET ORDER) - ADVANCE DIET AS TOLERATED.  NURSING TO ENTER SECONDARY ORDER FOR ACUTAL DIET ORDER.     Frequency: UNTIL DISCONTINUED     Number of Occurrences: Until Specified     Order Comments: If instructions  require a change in diet, nursing needs to enter appropriate secondary diet order.      NURSING IF PATIENT NOT SEEN BY PHYSICAL THERAPY ON DAY OF SURGERY: 2 HRS AFTER SURGERY, STAND PATIENT AND WALK PATIENT 80 FEET.     Frequency: UNTIL DISCONTINUED     Number of Occurrences: Until Specified    PT IS HIGH RISK FOR VENOUS THROMBOEMBOLISM     Frequency: CONTINUOUS     Number of Occurrences: Until Specified    PT IS INTERMEDIATE RISK FOR VENOUS THROMBOEMBOLISM     Frequency: CONTINUOUS     Number of Occurrences: Until Specified    PULSE OXIMETRY Q4H     Frequency: Q4H     Number of Occurrences: Until Specified    TELEMETRY MONITORING - Continuous     Frequency: CONTINUOUS     Number of Occurrences: Until Specified    TRAPEZE TO BED     Frequency: CONTINUOUS     Number of Occurrences: Until Specified    VITAL SIGNS  Q4H     Frequency: Q4H     Number of Occurrences: Until Specified    VITAL SIGNS WITH PULSE OXIMETRY Q4HRS x 24HRS, THEN Q8HRS     Frequency: UNTIL DISCONTINUED     Number of Occurrences: Until Specified    WEIGH PATIENT     Frequency: DAILY (0600)     Number of Occurrences: Until Specified    WEIGH PATIENT     Frequency: UPON ADMISSION     Number of Occurrences: 1 Occurrences   Code Status    FULL CODE: ATTEMPT RESUSCITATION / CPR     Frequency: CONTINUOUS     Number of Occurrences: Until Specified     Order Comments: Patient wishes for full ICU level care including advanced airway interventions / mechanical ventilation.     In the event of pulseless cardiac arrest, patient consents to ACLS (advanced cardiac life support) to attempt resuscitation.   IE - Consents to chest compressions, life support including intubation, mechanical ventilation, defibrillation/cardioversion as indicated.         Consult    IP CONSULT TO CARE MANAGEMENT     Frequency: ONE TIME     Number of Occurrences: 1 Occurrences    IP CONSULT TO ORTHOPEDICS Requested Provider; JOESPH KOCHER     Frequency: ONE TIME     Number of Occurrences: 1 Occurrences   OT    OT EVAL & TREAT     Frequency: Per Therapist Discretion     Number of Occurrences: 1 Occurrences     Scheduling Instructions:               PT    PT EVALUATE AND TREAT     Frequency: Per Therapist Discretion     Number of Occurrences: 1 Occurrences     Order Comments: Post Op Total Hip & Total Knee patients - Physical Therapy to start day of surgery if patient on nursing unit by 2pm.  Post Op Total Shoulder patients - Physical Therapy to start POD #1       Scheduling Instructions:               Respiratory Care    INCENTIVE SPIROMETRY - RT INSTRUCT     Frequency: ONE TIME     Number of Occurrences: 1 Occurrences    OXYGEN - NASAL CANNULA     Frequency: PRN     Number of Occurrences: Until Specified     Order  Comments: Flowrate should not exeed 6L/min except [facility] facility.           OXYGEN WEANING PARAMETERS     Frequency: UNTIL DISCONTINUED     Number of Occurrences: Until Specified   Point of Care Testing    PERFORM POC WHOLE BLOOD GLUCOSE     Frequency: TID AC & HS     Number of Occurrences: Until Specified   Precaution    HIP PRECAUTIONS - NO HYPEREXTENSION, NO EXTERNAL ROTATION     Frequency: UNTIL DISCONTINUED     Number of Occurrences: Until Specified   Medications    acetaminophen  (TYLENOL ) tablet     Frequency: Q4H PRN     Dose: 650 mg     Route: Oral    alcohol  62 % (NOZIN NASAL SANITIZER) nasal swab packet     Frequency: 2x/day     Dose: 1 Each     Route: Each Nostril    aspirin  chewable tablet 81 mg     Frequency: 2x/day     Dose: 81 mg     Route: Oral    bisacodyl  (DULCOLAX) rectal suppository     Frequency: Daily PRN      Dose: 10 mg     Route: Rectal    cholecalciferol  (VITAMIN D3) 1000 unit (25 mcg) tablet     Frequency: Daily     Dose: 1,000 Units     Route: Oral    Correction/SSIP insulin  lispro 100 units/mL injection     Frequency: 4x/day AC     Dose: 2-9 Units     Route: Subcutaneous    dextrose  (GLUTOSE) 40% oral gel     Frequency: Q15 Min PRN     Dose: 15 g     Route: Oral    dextrose  50% (0.5 g/mL) injection - syringe     Frequency: Q15 Min PRN     Dose: 12.5 g     Route: Intravenous    dilTIAZem  (CARDIZEM  CD) 24 hr extended release capsule     Frequency: Daily     Dose: 240 mg     Route: Oral    diphenhydrAMINE  (BENADRYL ) 50 mg/mL injection     Frequency: Q6H PRN     Dose: 12.5 mg     Route: Intravenous    docusate sodium  (COLACE) capsule     Frequency: 2x/day     Dose: 100 mg     Route: Oral    donepezil  (ARICEPT ) tablet     Frequency: Daily     Dose: 10 mg     Route: Oral    famotidine  (PEPCID ) tablet     Frequency: 2x/day     Dose: 20 mg     Route: Oral    ferrous sulfate  324 mg (65 mg elemental IRON ) tablet     Frequency: Daily before Breakfast     Dose: 324 mg     Route: Oral    glucagon  injection 1 mg     Frequency: Once PRN     Dose: 1 mg     Route: IntraMUSCULAR    heparin  5,000 unit/mL injection     Frequency: Q8HRS     Dose: 5,000 Units     Route: Subcutaneous    HYDROmorphone  (DILAUDID ) 2 mg/mL injection     Frequency: Q4H PRN     Dose: 0.2 mg     Route: Intravenous    HYDROmorphone  (DILAUDID ) 2 mg/mL injection     Frequency: Q4H PRN  Dose: 0.2 mg     Route: Intravenous    ipratropium-albuterol  0.5 mg-3 mg(2.5 mg base)/3 mL Solution for Nebulization     Frequency: Q4H PRN     Dose: 3 mL     Route: Nebulization    ketorolac  (TORADOL ) 30 mg/mL injection     Frequency: Q8HRS     Dose: 15 mg     Route: Intravenous    LORazepam  (ATIVAN ) tablet     Frequency: 3x/day     Dose: 1 mg     Route: Oral    losartan  (COZAAR ) tablet     Frequency: Daily     Dose: 25 mg     Route: Oral    LR premix infusion     Frequency:  Continuous     Route: Intravenous    Magic Mouthwash     Frequency: 3x/day PRN     Dose: 10 mL     Route: Swish & Spit    metoprolol  succinate (TOPROL -XL) 24 hr extended release tablet     Frequency: 2x/day     Dose: 12.5 mg     Route: Oral    montelukast  (SINGULAIR ) 10 mg tablet     Frequency: Daily     Dose: 10 mg     Route: Oral    morphine  2 mg/mL injection     Frequency: Q3H PRN     Dose: 2 mg     Route: Intravenous    morphine  2 mg/mL injection     Frequency: Q2H PRN     Dose: 2 mg     Route: Intravenous    multivitamin (THERA) tablet     Frequency: Daily     Dose: 1 Tablet     Route: Oral    naloxone  (NARCAN ) 0.4 mg/mL injection     Frequency: Q2 MIN PRN     Dose: 0.4 mg     Route: Intravenous    ondansetron  (ZOFRAN ) 2 mg/mL injection     Frequency: Q4H PRN     Dose: 4 mg     Route: Intravenous    oxyCODONE -acetaminophen  (PERCOCET) 5-325mg  per tablet     Frequency: Q4H PRN     Dose: 1 Tablet     Route: Oral    oxyCODONE -acetaminophen  (PERCOCET) 5-325mg  per tablet     Frequency: Q4H PRN     Dose: 2 Tablet     Route: Oral    pantoprazole  (PROTONIX ) delayed release tablet     Frequency: Daily     Dose: 40 mg     Route: Oral    pravastatin  (PRAVACHOL ) tablet     Frequency: QPM     Dose: 80 mg     Route: Oral    prochlorperazine  (COMPAZINE ) 5 mg/mL injection     Frequency: Q6H PRN     Dose: 10 mg     Route: Intravenous    sertraline  (ZOLOFT ) tablet     Frequency: Daily     Dose: 100 mg     Route: Oral    traMADol  (ULTRAM ) tablet     Frequency: Q6HRS     Dose: 50 mg     Route: Oral        Review of Systems:  Focused review of system was completed. Refer to the HPI for ROS details.     Today's Physical Exam:  Physical Exam  Vitals reviewed.   Constitutional:       Appearance: Normal appearance.   HENT:  Head: Normocephalic and atraumatic.      Mouth/Throat:      Mouth: Mucous membranes are moist.   Eyes:      Extraocular Movements: Extraocular movements intact.   Cardiovascular:      Rate and Rhythm: Normal rate  and regular rhythm.      Heart sounds: No murmur heard.  Pulmonary:      Effort: Pulmonary effort is normal.      Breath sounds: Normal breath sounds.      Comments: Wearing O2 NC  Abdominal:      General: Abdomen is flat.      Palpations: Abdomen is soft.      Tenderness: There is no abdominal tenderness.   Musculoskeletal:         General: Tenderness (Right hip) present.      Cervical back: Normal range of motion and neck supple.   Skin:     General: Skin is warm and dry.   Neurological:      Mental Status: She is alert and oriented to person, place, and time.   Psychiatric:         Mood and Affect: Mood normal.         Behavior: Behavior normal.          I/O:  I/O last 24 hours:    Intake/Output Summary (Last 24 hours) at 09/20/2023 1039  Last data filed at 09/20/2023 0954  Gross per 24 hour   Intake 1560 ml   Output 300 ml   Net 1260 ml     I/O current shift:  07/24 0700 - 07/24 1859  In: 120 [P.O.:120]  Out: 300 [Urine:300]    Labs:  Reviewed: I have reviewed all lab results.  Lab Results Today:    Results for orders placed or performed during the hospital encounter of 09/18/23 (from the past 24 hours)   POC BLOOD GLUCOSE (RESULTS)   Result Value Ref Range    GLUCOSE, POC 128 (H) 70 - 100 mg/dl   POC BLOOD GLUCOSE (RESULTS)   Result Value Ref Range    GLUCOSE, POC 193 (H) 70 - 100 mg/dl   CBC WITH DIFF   Result Value Ref Range    WBC 10.2 3.8 - 11.8 x10^3/uL    RBC 3.76 3.63 - 4.92 x10^6/uL    HGB 11.9 10.9 - 14.3 g/dL    HCT 66.3 68.7 - 58.0 %    MCV 89.2 75.5 - 95.3 fL    MCH 31.7 24.7 - 32.8 pg    MCHC 35.5 32.3 - 35.6 g/dL    RDW 86.9 87.6 - 82.2 %    PLATELETS 129 (L) 140 - 440 x10^3/uL    MPV 7.4 (L) 7.9 - 10.8 fL    NEUTROPHIL % 85 (H) 43 - 77 %    LYMPHOCYTE % 6 (L) 16 - 46 %    MONOCYTE % 8 4 - 11 %    EOSINOPHIL % 0 (L) 1 - 7 %    BASOPHIL % 0 0 - 1 %    NEUTROPHIL # 8.70 (H) 1.90 - 8.20 x10^3/uL    LYMPHOCYTE # 0.60 (L) 1.10 - 3.10 x10^3/uL    MONOCYTE # 0.80 0.20 - 0.90 x10^3/uL    EOSINOPHIL # 0.00  0.00 - 0.50 x10^3/uL    BASOPHIL # 0.00 0.00 - 0.10 x10^3/uL   BASIC METABOLIC PANEL, NON-FASTING   Result Value Ref Range    SODIUM 133 (L) 136 - 145 mmol/L  POTASSIUM 4.5 3.5 - 5.1 mmol/L    CHLORIDE 102 98 - 107 mmol/L    CO2 TOTAL 25 21 - 31 mmol/L    ANION GAP 6 4 - 13 mmol/L    CALCIUM 9.3 8.6 - 10.3 mg/dL    GLUCOSE 873 (H) 74 - 109 mg/dL    BUN 20 7 - 25 mg/dL    CREATININE 8.93 9.39 - 1.30 mg/dL    BUN/CREA RATIO 19 6 - 22    ESTIMATED GFR 52 (L) >59 mL/min/1.17m^2    OSMOLALITY, CALCULATED 271 270 - 290 mOsm/kg   POC BLOOD GLUCOSE (RESULTS)   Result Value Ref Range    GLUCOSE, POC 126 (H) 70 - 100 mg/dl     Micro Results: No results found for any visits on 09/18/23 (from the past 24 hours).  Images:   XR AP MOBILE CHEST  Result Date: 09/18/2023  Impression NEGATIVE CHEST Radiologist location ID: [id number]     XR HIP RIGHT W PELVIS 2-3 VIEWS  Result Date: 09/18/2023  Impression Acute displaced right femoral neck fracture  Radiologist location ID: [id number]      XR HIP RIGHT 1 VIEW   Final Result   Expected postoperative appearance of recent right hip arthroplasty                Radiologist location ID: [id number]         XR HIP RIGHT W PELVIS 2-3 VIEWS   Final Result   Acute displaced right femoral neck fracture                      Radiologist location ID: [id number]         XR AP MOBILE CHEST   Final Result   NEGATIVE CHEST            Radiologist location ID: [id number]              Problem List:  Active Hospital Problems   (*Primary Problem)    Diagnosis    *Fracture of right hip    Depression    Chronic kidney disease, stage 3 unspecified         Chronic obstructive pulmonary disease, unspecified         Essential hypertension    Type 2 diabetes mellitus         Mixed hyperlipidemia       Nutrition:   DIET REGULAR Do you want to initiate MNT Protocol? Yes             Additional clinical characteristics related to nutrition:    - monitor for weight changes   - monitor intake and output    -  monitor bowel functions        Lab Results   Component Value Date    ALBUMIN 4.5 09/19/2023     Assessment/ Plan:   Fracture of the right hip  -ortho consult, surgery planned for today  -pain control  2. Depression/Anxiety  -home medications  3. HTN  -home meds  -lopressor  25mg  at home, given 12.5mg  q day here, will increase to 12.5mg  bid  4. GERD   -protonix   5. Iron  deficiency  -home oral iron  replacement  6. Hyperlipidemia  -home pravastatin   7. Type 2 DM  -SSI       Hospitalist personally evaluated and examined the patient in conjunction with the MLP and agree with the assessments, treatment plan and disposition of the patient as recorded by the [facility].  DVT/PE Prophylaxis: Heparin     Discharge Planning: tbd    Recardo Hedger, PA

## 2023-09-20 NOTE — PT Treatment (Signed)
 [facility]  [address]  [place], [address]  [phone]  (Fax) [phone]  Rehabilitation Department  Physical Therapy Daily Inpatient Note    Date: 09/20/2023  Patient's Name: Karen Chase  Date of Birth: [birth year]  Height: Height: 157.5 cm (5' 2)  Weight: Weight: 70.7 kg (155 lb 14.4 oz)      Plan: Will continue under current POC.      Discharge Disposition: (P) skilled nursing facility        Subjective/Objective/Assessment:  Flowsheet    09/20/23 1413   Rehab Session   Document Type therapy progress note (daily note)   PT Visit Date 09/20/23   General Information   Patient Profile Reviewed yes   Medical Lines PIV Line;Telemetry   Respiratory Status room air   Existing Precautions/Restrictions posterior hip precautions   Pre Treatment Status   Pre Treatment Patient Status Patient sitting in bedside chair or w/c   Support Present Pre Treatment  None   Communication Pre Treatment  Charge Nurse   Communication Pre Treatment Comment clear   Pre-Treatment Pain   Pretreatment Pain Rating 4/10   Therapeutic Exercise   Comment patient instructed with ther ex to the right le, consisting of anklepumps,quad sets, heelslides, abd/add 10-20 reps   Post Treatment Status   Post Treatment Patient Status Patient supine in bed   Communication Post Treatement Nurse   Patient Effort adequate   Cognitive Assessment/Intervention   Behavior/Mood Observations alert;anxious;behavior appropriate to situation, WNL/WFL   Physical Therapy Time and Intention   Total PT Minutes: 14   Therapy Plan Review/Discharge Plan (PT)   Anticipated Discharge Disposition skilled nursing facility     Patient instructed with there ex to the right le, 10-20 reps, reviewed hip precautions as well          Goals:     scoot/bridge, supine to sit/sit to supine  supervision required, set up required       stand-by assistance  walker, rolling  50  with improved gait pattern, hip and knee flex          sit-to-stand/stand-to-sit, bed-to-chair/chair-to-bed  stand-by assistance  walker, rolling                     Intervention minutes: THERAPEUTIC ACTIVITY 14 minutes    THERAPIST  Orie Molt, PTA  09/20/2023, 15:15

## 2023-09-20 NOTE — Nurses Notes (Signed)
 Patient lost IV access (pulled out by patient). Nurse attempted x1, and was unsuccessful. Patient has poor veins. Patient asked if we could try again later, stated I'm so tired, I just want to rest. Notified CN, and fellow RN. They will try before shift change.

## 2023-09-20 NOTE — PT Evaluation (Signed)
 .. [facility] Medicine [facility]  [address]  [place], [address]  [phone]  (Fax) [phone]  Rehabilitation Services  Physical Therapy Inpatient Initial Evaluation    Patient Name: Karen Chase  Date of Birth: September 27, 1940  Height: Height: 157.5 cm (5' 2)  Weight: Weight: 70.7 kg (155 lb 14.4 oz)  Room/Bed: 374/A  Payor: HU[employer]NA MEDICARE / Plan: HU[employer]NA MEDICARE HMO / Product Type: MEDICARE MC /       PMH:  Past Medical History:   Diagnosis Date    Chronic obstructive pulmonary disease, unspecified 10/31/2022    Esophageal reflux     controlled with med    History of tuberculosis     as teenager    Hyperlipidemia     Hypertension     Type 2 diabetes mellitus            Assessment:      (P) Pt is day 1 postop right hip bipolar hemiarthroplasty, hx of mechanical fall and right femoral neck fracture. Presents with confusion, anxiety, unable to follow even one-step commands, sequencing. Pt is distracted, requires >75% multimodal cues about >75% throughout this visit with scooting out to the edge of chair, standing transfers and most especially gait/AD use and foot placement. Unable to bend right hip and knee a bit and amb with stiff right leg. Pt notes that she feels she just mobilized enough for today and did not expect that she would walk today. Voiced understanding and participated to get up and take a few steps. Required 2 LPM of O2,  with O2 sat being 89% resting on room air initially. Follow for right bipolar hip rehab, might require SNF rehab depending on cognitive and functional status upon hosp DC. Will need FWW, BSC, and likely wheelchair depending on progress.    Total Distance Ambulated: (P) 5  Independence: (P) moderate assist (50% patient effort), 1 person + 1 person to manage equipment, nonverbal cues required (demo/gesture), contact guard assist  Assistive Device: (P) walker, front wheeled      Discharge Needs:    Equipment Recommendation: (P) front wheeled walker,  bedside commode, wheelchair      The patient presents with mobility limitations due to impaired balance, impaired range of motion, impaired strength, impaired functional activity tolerance, and impaired cognition that significantly impair/prevent patient's ability to participate in mobility-related activities of daily living (MRADLs) including  ambulation and transfers in order to safely complete, safely entering/exiting the home, in reasonable time. This functional mobility deficit can be sufficiently resolved with the use of a (P) front wheeled walker, bedside commode, wheelchair  in order to decrease the risk of falls, morbidity, and mortality in performance of these MRADLs.  Patient is able to safely use this assistive device.    Discharge Disposition: (P) skilled nursing facility    JUSTIFICATION OF DISCHARGE RECOMMENDATION   Based on current diagnosis, functional performance prior to admission, and current functional performance, this patient requires continued PT services in (P) skilled nursing facility in order to achieve significant functional improvements in these deficit areas: (P) aerobic capacity/endurance, arousal, attention, and cognition, gait, locomotion, and balance, joint integrity and mobility, muscle performance, posture, ROM (range of motion).        Plan:   Current Intervention: (P) balance training, bed mobility training, gait training, home exercise program, patient/family education, postural re-education, ROM (range of motion), strengthening, transfer training  To provide physical therapy services (P) other (see comments) (1-3x/day, Mon-Sat until DC)  for duration  of (P) until discharge.    The risks/benefits of therapy have been discussed with the patient/caregiver and he/she is in agreement with the established plan of care.       Subjective & Objective     Past Medical History:   Diagnosis Date    Chronic obstructive pulmonary disease, unspecified 10/31/2022    Esophageal reflux      controlled with med    History of tuberculosis     as teenager    Hyperlipidemia     Hypertension     Type 2 diabetes mellitus             Past Surgical History:   Procedure Laterality Date    COLONOSCOPY      HAND SURGERY Right     HX HYSTERECTOMY      HX LAP CHOLECYSTECTOMY                   09/20/23 0942   Rehab Session   Document Type evaluation   PT Visit Date 09/20/23   General Information   Patient Profile Reviewed yes   Pertinent History of Current Functional Problem Pt is s/p right hip bipolar arthroplasty, hx of mechanical fall causing right hip femoral neck fracture   Medical Lines PIV Line;Telemetry   Respiratory Status room air   Existing Precautions/Restrictions fall precautions;posterior hip precautions;lateral hip precautions   Mutuality/Individual Preferences   Anxieties, Fears or Concerns mobility, does not want to move too much today   Individualized Care Needs re-orient, address confusion, up in [facility], hip precautions   Patient-Specific Goals (Include Timeframe) DC to home when medically clear   Plan of Care Reviewed With patient;sibling   Living Environment   Lives With spouse;child(ren), adult   Living Arrangements house   Home Assessment: No Problems Identified   Home Accessibility bed and bath on same level   Functional Level Prior   Ambulation 0 - independent   Transferring 0 - independent   Toileting 0 - independent   Bathing 0 - independent   Dressing 0 - independent   Eating 0 - independent   Communication 0 - understands/communicates without difficulty   Prior Functional Level Comment Pt is indep on all levels, indep with amb without AD use, ADLs. Drives, community-dweller. On room air.   Pre Treatment Status   Pre Treatment Patient Status Patient sitting in bedside chair or w/c;Call light within reach;Nurse approved session   Support Present Pre Treatment  Other (See comments);Family present  (PCT, sibling)   Web designer Pre Treatment Comment  clear   Cognitive Assessment/Interventions   Behavior/Mood Observations alert;anxious;behavior appropriate to situation, WNL/WFL;confused;cooperative;distractible;restless   Orientation Status oriented x 3;verbal cues/prompts needed for orientation   Attention moderate impairment;needs cues to re-direct;difficult dividing attention;distractible;difficulty attending to task/directions   Follows Commands follows one step commands;physical/tactile prompts required;repetition of directions required;verbal cues/prompting required   Comment Pt is talkative and appears very anxious, was brushing her hair for more than 5 mins and still requested her brush to be placed inside the heart monitor plastic bag around her neck, unable to follow one-step command with AD use and foot placement.   Pre- Treatment Vital Signs   Pre SpO2 (%) 89   O2 Delivery Pre Treatment room air   Vitals Comment placed back on O2 supp 2LPM   Pre-Treatment Pain   Pretreatment Pain Rating 6/10   RUE Assessment   RUE Assessment WFL- Within Functional Limits  LUE Assessment   LUE Assessment WFL- Within Functional Limits   RLE Assessment   RLE Assessment X-Exceptions   RLE ROM Hip;Knee  (Right hip precautions; limited hip and knee - muscle guarding)   LLE Assessment   LLE Assessment WFL- Within Functional Limits   Trunk Assessment   Trunk Assessment WFL-Within Functional Limits   Mobility Assessment/Training   Additional Documentation Transfer Assessment/Treatment (Group);Gait Assessment/Treatment (Group)   Transfer Assessment/Treatment   Sit-Stand Independence moderate assist (50% patient effort)   Stand-Sit Independence minimum assist (75% patient effort)   Sit-Stand-Sit, Assist Device walker, front wheeled   Bed-Chair Independence minimum assist (75% patient effort)   Bed-Chair-Bed Assist Device walker, front wheeled   Transfer Safety Issues cognitive deficits limit understanding;balance decreased during turns;sequencing ability decreased;step length  decreased;weight-shifting ability decreased;loses balance backward;steps too close to front of assistive device   Transfer Impairments cognition impaired;balance impaired;coordination impaired;endurance;flexibility decreased;pain;ROM decreased;strength decreased   Gait Assessment/Treatment   Total Distance Ambulated 5   Independence  moderate assist (50% patient effort);1 person + 1 person to manage equipment;nonverbal cues required (demo/gesture);contact guard assist   Assistive Device  walker, front wheeled   Distance in Feet from chair to the door   Gait Speed slow and discontinuous   Deviations  cadence decreased;double stance time increased;lateral shift;limb motion velocity decreased;narrow BOS;step length decreased;stride length decreased;swing-to-stance ratio decreased;toe-to-floor clearance decreased;weight-shifting ability decreased   Safety Issues  cognitive deficits limit understanding;balance decreased during turns;sequencing ability decreased;step length decreased;weight-shifting ability decreased;loses balance backward;steps too close to front of assistive device   Impairments  cognition impaired;balance impaired;coordination impaired;endurance;flexibility decreased;pain;ROM decreased;strength decreased   Motor Skills/Interventions   Additional Documentation Balance Skills Training (Group)   Balance   Sitting Balance: Static good balance   Sitting, Dynamic (Balance) fair + balance   Sit-to-Stand Balance fair - balance   Standing Balance: Static fair - balance   Standing Balance: Dynamic poor + balance   Therapeutic Exercise   Comment foot pumps only. too confused to receive more instructions at this time   Post Treatment Status   Post Treatment Patient Status Patient sitting in bedside chair or w/c;Call light within reach;Patient safety alarm activated   Support Present Post Treatment  Family present   Communication Post Treatement Nurse;Care Management   Communication Post Treatment Comment confused,  updates   Patient Effort fair   Post-Treatment Vital Signs   Post-treatment Heart Rate (beats/min) 72   Post SpO2 (%) 97   O2 Delivery Post Treatment supplemental O2   Physical Therapy Clinical Impression   Assessment Pt is day 1 postop right hip bipolar hemiarthroplasty, hx of mechanical fall and right femoral neck fracture. Presents with confusion, anxiety, unable to follow even one-step commands, sequencing. Pt is distracted, requires >75% multimodal cues about >75% throughout this visit with scooting out to the edge of chair, standing transfers and most especially gait/AD use and foot placement. Unable to bend right hip and knee a bit and amb with stiff right leg. Pt notes that she feels she just mobilized enough for today and did not expect that she would walk today. Voiced understanding and participated to get up and take a few steps. Required 2 LPM of O2,  with O2 sat being 89% resting on room air initially. Follow for right bipolar hip rehab, might require SNF rehab depending on cognitive and functional status upon hosp DC. Will need FWW, BSC, and likely wheelchair depending on progress.   Criteria for Skilled Therapeutic yes;meets criteria;skilled treatment is necessary  Impairments Found (describe specific impairments) aerobic capacity/endurance;arousal, attention, and cognition;gait, locomotion, and balance;joint integrity and mobility;muscle performance;posture;ROM (range of motion)   Functional Limitations in Following  self-care;home management;community/leisure   Rehab Potential good   Therapy Frequency other (see comments)  (1-3x/day, Mon-Sat until DC)   Predicted Duration of Therapy Intervention (days/wks) until discharge   Anticipated Equipment Needs at Discharge (PT) front wheeled walker;bedside commode;wheelchair   Anticipated Discharge Disposition skilled nursing facility   Evaluation Complexity Justification   Patient History: Co-morbidity/factors that impact Plan of Care COPD: decreased  respiratory capacity impacting function;Diabetes: altered sensation &/or impaired function;Fracture: cause pain &/or impaired function;Surgical procedure: causing pain &/or impaired function;One or more other medical co-morbidity;Social support barriers/factors;Healthcare literacy;Age;Behavioral patterns  (pt's spouse sounds like has some ongoing health issues affecting mobility)   Examination Components 4 or more Exam elements addressed;Vital signs (BP, HR, RR, or O2 saturation);Range of motion;Strength;Balance;Transfers;Ambulation;Other (comment)  (mental status)   Presentation Evolving: Symptoms, complaints, characteristics of condition changing &/or cognitive deficits present   Clinical Decision Making Low complexity   Evaluation Complexity Low complexity   Care Plan Goals   PT Rehab Goals Bed Mobility Goal;Range of Motion Goal;Transfer Training Goal;Gait Training Goal   Planned Therapy Interventions, PT Eval   Planned Therapy Interventions (PT) balance training;bed mobility training;gait training;home exercise program;patient/family education;postural re-education;ROM (range of motion);strengthening;transfer training   Transfer Training Goal   Transfer Training Goal, Date Established 09/20/23   Transfer Training Goal, Time to Achieve by discharge   Transfer Training Goal, Activity Type sit-to-stand/stand-to-sit;bed-to-chair/chair-to-bed   Transfer Training Goal, Current Status moderate assist (50% patient effort);1 person + 1 person to manage equipment;verbal cues required;nonverbal cues required (demo/gesture)   Transfer Training Goal, Independence Level stand-by assistance   Transfer Training Goal, Assist Device walker, rolling   Gait Training  Goal, Distance to Achieve   Gait Training  Goal, Date Established 09/20/23   Gait Training  Goal, Time to Achieve by discharge   Gait Training  Goal, Current Status moderate assist (50% patient effort);minimum assist (75% patient effort);2 person assist required   Gait  Training  Goal, Independence Level stand-by assistance   Gait Training  Goal, Assist Device walker, rolling   Gait Training  Goal, Distance to Achieve 50   Gait Training  Goal, Additional Goal with improved gait pattern, hip and knee flex   Bed Mobility Goal   Bed Mobility Goal, Date Established 09/20/23   Bed Mobility Goal, Time to Achieve by discharge   Bed Mobility Goal, Activity Type scoot/bridge;supine to sit/sit to supine   Bed Mobility Goal, Current Status other (see comments);verbal cues required;nonverbal cues required (demo/gesture)  (NA, patient already up in chair)   Bed Mobility Goal, Independence Level supervision required;set up required   Range of Motion Goal   Range of Motion Goal, Date Established 09/20/23   Range of Motion Goal, Time to Achieve by discharge   Range of Motion Goal, AROM Fxnal Goal right hip and knee flex during gait step and stance phase   Physical Therapy Time and Intention   Total PT Minutes: 22               INTERVENTION MINUTES: EVALUATION 11 minutes and GAIT TRAINING 11 MINUTES    EVALUATION COMPLEXITY : CLINICAL DECISION MAKING OF LOW COMPLEXITY AS INDICATED BY PMH, PHYSICAL THERAPY ASSESSMENT OF MUSCULOSKELETAL AND NEUROLOGICAL SYSTEMS AND ACTIVITY LIMITATIONS. CLINICAL PRESENTATION IS STABLE AND UNCOMPLICATED    Therapist:     Tanisia Yokley, PT  09/20/2023, 11:32

## 2023-09-20 NOTE — Care Management Notes (Signed)
 [facility]  Care Management Initial Evaluation    Patient Name: Karen Chase  Date of Birth: May 07, 1940  Sex: female  Date/Time of Admission: 09/18/2023  9:27 PM  Room/Bed: 374/A  Payor: HUMANA MEDICARE / Plan: MYLENE MEDICARE HMO / Product Type: MEDICARE MC /   Primary Care Providers:  Christin Bailey LABOR, MD, MD (General)    Pharmacy Info:   Preferred Pharmacy       [id number] - [place], [place] - [address]    [address] [place] [address]    Phone: [phone] Fax: [phone]    Hours: Not open 24 hours          Emergency Contact Info:   Extended Emergency Contact Information  Primary Emergency Contact: Jeffers,Tammy  Mobile Phone: [phone]  Relation: Daughter  Preferred language: English    History:   MALILLANY KAZLAUSKAS is a 83 y.o., female, admitted 09/18/23.    Height/Weight: 157.5 cm (5' 2) / 70.7 kg (155 lb 14.4 oz)     LOS: 2 days   Admitting Diagnosis: Fracture of right hip [S72.001A]    Assessment:      09/20/23 1629   Assessment Details   Assessment Type Admission   Date of Care Management Update 09/20/23   Readmission   Is this a readmission? No   Insurance Information/Type   Insurance type Medicare   Employment/Financial   Patient has Prescription Coverage?  Yes        Name of Insurance Coverage for Medications [employer]   Financial/Environmental Concerns none   Living Environment   Select an age group to open lives with row.  Adult   Lives With spouse   Living Arrangements house   Able to Return to Prior Arrangements yes  (after rehab)   IEP and/or 504 Plan? No   Home Safety   Home Assessment: No Problems Identified   Home Accessibility stairs within home   Custody and Legal Status   Do you have a court appointed guardian/conservator? No   Are you an emancipated minor? No   Custody Issues? No   Paternity Affidavit Requested? No   Care Management Plan   Discharge Planning Status initial meeting   Projected Discharge Date 09/20/23   Discharge plan discussed  with: Patient   CM will evaluate for rehabilitation potential yes   Patient choice offered to patient/family Yes   Facility or Agency Preferences [facility]   Discharge Needs Assessment   Outpatient/Agency/Support Group Needs skilled nursing facility   Equipment Currently Used at Home commode   Equipment Needed After Discharge none   Discharge Facility/Level of Care Needs Home (Patient/Family Member/other)(code 1)   Transportation Available car;family or friend will provide   Discharge Information   Discharge Disposition home or self-care   Referral Information   Admission Type inpatient   Arrived From home or self-care   Stairs Within Home, Primary   Number of Stairs, Within Home, Primary other (see comments)  (13 steps)     Initial CM assessment completed. Initial CM assessment completed. Pt lives at home with her husband. There are no stairs to enter the home. There are 13 steps to get to the basement. Pt has a bedside commode. Pt uses [employer] mail order for maintenance medications. Pt uses Psych[facility], forensic in [place], [place]  for emergent needs.Pt would like to go to [facility] in [place], [place]. CM faxed referral to [facility]. She will need the most recent physical therapy note tomorrow and  a chest xray for her to submit auth.CM will fax tomorrow as soon as they are available.    Discharge Plan:  SNF Placement (Medicare certified) (code 3)      The patient will continue to be evaluated for developing discharge needs.     Case Manager: Knox Ahle, [place]  Phone: [phone]

## 2023-09-21 DIAGNOSIS — N183 Chronic kidney disease, stage 3 unspecified: Secondary | ICD-10-CM

## 2023-09-21 LAB — URINALYSIS, MACROSCOPIC
BILIRUBIN: NEGATIVE mg/dL
BLOOD: NEGATIVE mg/dL
GLUCOSE: NEGATIVE mg/dL
KETONES: NEGATIVE mg/dL
LEUKOCYTES: NEGATIVE WBCs/uL
NITRITE: NEGATIVE
PH: 6 (ref 5.0–9.0)
PROTEIN: NEGATIVE mg/dL
SPECIFIC GRAVITY: 1.006 (ref 1.002–1.030)
UROBILINOGEN: NORMAL mg/dL

## 2023-09-21 LAB — POC BLOOD GLUCOSE (RESULTS)
GLUCOSE, POC: 132 mg/dL — ABNORMAL HIGH (ref 70–100)
GLUCOSE, POC: 128 mg/dL — ABNORMAL HIGH (ref 70–100)
GLUCOSE, POC: 187 mg/dL — ABNORMAL HIGH (ref 70–100)

## 2023-09-21 LAB — URINALYSIS, MICROSCOPIC
SQUAMOUS EPITHELIAL: 1 /HPF (ref <28)
WBCS: 1 /HPF (ref <6)

## 2023-09-21 MED ORDER — METOPROLOL SUCCINATE ER 25 MG TABLET,EXTENDED RELEASE 24 HR
25.0000 mg | ORAL_TABLET | Freq: Two times a day (BID) | ORAL | Status: DC
Start: 2023-09-22 — End: 2023-09-22

## 2023-09-21 MED ORDER — GUAIFENESIN ER 600 MG TABLET, EXTENDED RELEASE 12 HR
600.0000 mg | EXTENDED_RELEASE_TABLET | Freq: Two times a day (BID) | ORAL | Status: DC
Start: 2023-09-21 — End: 2023-09-24
  Administered 2023-09-21 – 2023-09-24 (×7): 600 mg via ORAL
  Filled 2023-09-21 (×6): qty 1

## 2023-09-21 NOTE — PT Treatment (Signed)
 [facility] Medicine [facility]  [address]  [place], [address]  [phone]  (Fax) [phone]  Rehabilitation Department  Physical Therapy Daily Inpatient Note    Date: 09/21/2023  Patient's Name: Karen Chase  Date of Birth: November 10, 1940  Height: Height: 157.5 cm (5' 2)  Weight: Weight: 76 kg (167 lb 8 oz)      Plan: Will continue under current POC.      Discharge Disposition: (P) skilled nursing facility        Subjective/Objective/Assessment:  Flowsheet    09/21/23 0930   Rehab Session   Document Type therapy progress note (daily note)   PT Visit Date 09/21/23   General Information   Patient Profile Reviewed yes   Medical Lines PIV Line;Telemetry   Respiratory Status room air   Existing Precautions/Restrictions posterior hip precautions   Pre Treatment Status   Pre Treatment Patient Status Patient supine in bed   Support Present Pre Treatment  None   Communication Pre Treatment  Charge Nurse   Communication Pre Treatment Comment cleared   Pre- Treatment Vital Signs   Pre-Treatment Heart Rate (beats/min) 68   Pre SpO2 (%) 95   O2 Delivery Pre Treatment room air   Pre-Treatment Pain   Pre/Posttreatment Pain Comment no complaint   Bed Mobility   Supine-Sit Independence moderate assist (50% patient effort)   Bed Mobility, Assistive Device bed rails;draw sheet   Transfer Assessment/Treatment   Sit-Stand Independence moderate assist (50% patient effort);2 person assist required   Stand-Sit Independence minimum assist (75% patient effort)   Sit-Stand-Sit, Assist Device walker, front wheeled   Transfer Impairments cognition impaired;endurance;strength decreased   Gait Assessment/Treatment   Total Distance Ambulated 12   Independence  moderate assist (50% patient effort)   Assistive Device  walker, front wheeled   Impairments  cognition impaired;endurance;pain;strength decreased   Comment cues for walker and foot placement   Balance   Sitting Balance: Static good balance   Sitting, Dynamic  (Balance) fair + balance   Sit-to-Stand Balance fair balance   Standing Balance: Static fair - balance   Post Treatment Status   Post Treatment Patient Status Patient sitting in bedside chair or w/c;Patient safety alarm activated   Support Present Post Treatment  Family present   Systems developer Nurse   Patient Effort adequate   Post-Treatment Vital Signs   Post-treatment Heart Rate (beats/min) 73   Post SpO2 (%) 92   O2 Delivery Post Treatment supplemental O2   Cognitive Assessment/Intervention   Behavior/Mood Observations behavior appropriate to situation, WNL/WFL   Physical Therapy Time and Intention   Total PT Minutes: 10   Therapy Plan Review/Discharge Plan (PT)   Anticipated Discharge Disposition skilled nursing facility     Patient in the bed, transferred supine to sit with mod of 1, standing with mod assist, gaited with rw 34ft to the chair, tired quickly, unable to rate pain, unable to recall any hip precautions, left up  in the chair with needs in reach, chair alarmed          Goals:     scoot/bridge, supine to sit/sit to supine  supervision required, set up required       stand-by assistance  walker, rolling  50  with improved gait pattern, hip and knee flex         sit-to-stand/stand-to-sit, bed-to-chair/chair-to-bed  stand-by assistance  walker, rolling  Intervention minutes: GAIT TRAINING 10 MINUTES    THERAPIST  Orie Molt, PTA  09/21/2023, 11:25

## 2023-09-21 NOTE — Progress Notes (Signed)
 [facility]               IP PROGRESS NOTE      Sydna, Brodowski M  Date of Admission:  09/18/2023  Date of Birth:  10/05/1940  Date of Service:  09/21/2023    Hospital Day:  LOS: 3 days     Subjective:   Karen Chase is a 83 y.o. female with a known past medical history of Chronic Obstructive Pulmonary Disease, chronic kidney disease, hypertension, diabetes mellitus, and CAD presented to the ED with right hip pain after mechanical fall over door threshold. Patient was found to have right femoral neck fracture.  Orthopedics was consulted from the emergency department, agreeable to follow. Patient does normally follow with Dr.Qazi for cardiac problems.  No history of echo in our EMR.      09/19/23 - pt seen and examined at bedside. Home meds reviewed. Notes pain to the right leg and hip. Surgery scheduled for today. Not other complaints at this time.    09/20/23 - pt seen and examined at bedside. States pain is better since surgery yesterday, controlled at this time. OT at bedside. PT to evaluate.     09/21/23 - pt seen and examined at bedside. No acute complaints at this time. She has been able to bear some weight with PT. Waiting on SNF placement.     Vital Signs:  Temp (24hrs) Max:36.4 C (97.6 F)      Temperature: 36.1 C (97 F)  BP (Non-Invasive): (!) 159/71  MAP (Non-Invasive): 97 mmHG  Heart Rate: 67  Respiratory Rate: 18  SpO2: 97 %    Current Medications:  acetaminophen  (TYLENOL ) tablet, 650 mg, Oral, Q4H PRN  alcohol  62 % (NOZIN NASAL SANITIZER) nasal swab packet, 1 Each, Each Nostril, 2x/day  aspirin  chewable tablet 81 mg, 81 mg, Oral, 2x/day  bisacodyl  (DULCOLAX) rectal suppository, 10 mg, Rectal, Daily PRN  cholecalciferol  (VITAMIN D3) 1000 unit (25 mcg) tablet, 1,000 Units, Oral, Daily  Correction/SSIP insulin  lispro 100 units/mL injection, 2-9 Units, Subcutaneous, 4x/day AC  dextrose  (GLUTOSE) 40% oral gel, 15 g, Oral, Q15 Min PRN  dextrose  50% (0.5 g/mL) injection - syringe, 12.5 g,  Intravenous, Q15 Min PRN  dilTIAZem  (CARDIZEM  CD) 24 hr extended release capsule, 240 mg, Oral, Daily  diphenhydrAMINE  (BENADRYL ) 50 mg/mL injection, 12.5 mg, Intravenous, Q6H PRN  docusate sodium  (COLACE) capsule, 100 mg, Oral, 2x/day  donepezil  (ARICEPT ) tablet, 10 mg, Oral, Daily  ferrous sulfate  324 mg (65 mg elemental IRON ) tablet, 324 mg, Oral, Daily before Breakfast  glucagon  injection 1 mg, 1 mg, IntraMUSCULAR, Once PRN  guaiFENesin  (MUCINEX ) extended release tablet - for cough (expectorant), 600 mg, Oral, 2x/day  heparin  5,000 unit/mL injection, 5,000 Units, Subcutaneous, Q8HRS  HYDROmorphone  (DILAUDID ) 2 mg/mL injection, 0.2 mg, Intravenous, Q4H PRN  HYDROmorphone  (DILAUDID ) 2 mg/mL injection, 0.2 mg, Intravenous, Q4H PRN  ipratropium-albuterol  0.5 mg-3 mg(2.5 mg base)/3 mL Solution for Nebulization, 3 mL, Nebulization, Q4H PRN  ketorolac  (TORADOL ) 30 mg/mL injection, 15 mg, Intravenous, Q8HRS  LORazepam  (ATIVAN ) tablet, 1 mg, Oral, 3x/day  losartan  (COZAAR ) tablet, 25 mg, Oral, Daily  LR premix infusion, , Intravenous, Continuous  Magic Mouthwash, 10 mL, Swish & Spit, 3x/day PRN  [START ON 09/22/2023] metoprolol  succinate (TOPROL -XL) 24 hr extended release tablet, 25 mg, Oral, 2x/day  montelukast  (SINGULAIR ) 10 mg tablet, 10 mg, Oral, Daily  morphine  2 mg/mL injection, 2 mg, Intravenous, Q3H PRN  morphine  2 mg/mL injection, 2 mg, Intravenous, Q2H PRN  multivitamin (THERA)  tablet, 1 Tablet, Oral, Daily  naloxone  (NARCAN ) 0.4 mg/mL injection, 0.4 mg, Intravenous, Q2 MIN PRN  ondansetron  (ZOFRAN ) 2 mg/mL injection, 4 mg, Intravenous, Q4H PRN  oxyCODONE -acetaminophen  (PERCOCET) 5-325mg  per tablet, 1 Tablet, Oral, Q4H PRN  oxyCODONE -acetaminophen  (PERCOCET) 5-325mg  per tablet, 2 Tablet, Oral, Q4H PRN  pantoprazole  (PROTONIX ) delayed release tablet, 40 mg, Oral, Daily  pravastatin  (PRAVACHOL ) tablet, 80 mg, Oral, QPM  prochlorperazine  (COMPAZINE ) 5 mg/mL injection, 10 mg, Intravenous, Q6H PRN  sertraline   (ZOLOFT ) tablet, 100 mg, Oral, Daily  traMADol  (ULTRAM ) tablet, 50 mg, Oral, Q6HRS        Current Orders:  Active Orders   Lab    URINALYSIS, MACROSCOPIC     Frequency: Once     Number of Occurrences: 1 Occurrences    URINALYSIS, MACROSCOPIC AND MICROSCOPIC W/CULTURE REFLEX [PRN ONLY]     Frequency: ONE TIME     Number of Occurrences: 1 Occurrences    URINALYSIS, MICROSCOPIC     Frequency: Once     Number of Occurrences: 1 Occurrences   Diet    DIET REGULAR Do you want to initiate MNT Protocol? Yes     Frequency: All Meals     Number of Occurrences: 1 Occurrences   Nursing    ACTIVITY     Frequency: UNTIL DISCONTINUED     Number of Occurrences: Until Specified    BLADDER SCAN IF PATIENT HAS NOT VOIDED WITHIN 4 HOURS     Frequency: Q4H PRN     Number of Occurrences: Until Specified    CHANGE OUTER DRESSING ONLY     Frequency: PRN     Number of Occurrences: Until Specified    COLD THERAPY     Frequency: UNTIL DISCONTINUED     Number of Occurrences: Until Specified    ENCOURAGE FREQUENT ANKLE/FOOT MOTION Q1HR WHILE AWAKE     Frequency: Q1H     Number of Occurrences: Until Specified    HEELS OFF BED/UTILIZE HEEL RELIEF FUNCTION ON BED     Frequency: CONTINUOUS     Number of Occurrences: Until Specified    HYPOGLYCEMIA MANAGEMENT - CONSCIOUS PATIENT W/DIET ORDER     Frequency: UNTIL DISCONTINUED     Number of Occurrences: Until Specified    HYPOGLYCEMIA MANAGEMENT - UNCONSCIOUS/ALTERED/NPO PATIENT     Frequency: UNTIL DISCONTINUED     Number of Occurrences: Until Specified    HYPOGLYCEMIA TREATMENT ALGORITHM     Frequency: UNTIL DISCONTINUED     Number of Occurrences: Until Specified    INCENTIVE SPIROMETRY NURSING     Frequency: Q1H WA     Number of Occurrences: 250 Occurrences    INTAKE AND OUTPUT QSHIFT     Frequency: QSHIFT     Number of Occurrences: Until Specified    INTAKE AND OUTPUT QSHIFT     Frequency: QSHIFT     Number of Occurrences: Until Specified    KEEP FOOT OF BED FLAT.  DO NOT PUT PILLOW UNDER KNEE      Frequency: UNTIL DISCONTINUED     Number of Occurrences: Until Specified    Notify MD Vital Signs     Frequency: PRN     Number of Occurrences: Until Specified    Notify MD Vital Signs     Frequency: PRN     Number of Occurrences: Until Specified     Order Comments: Notify MD if urine output is less than 500 mL 8 hours after surgery.      NURSING DIET INFORMATION (NOT A  DIET ORDER) - ADVANCE DIET AS TOLERATED.  NURSING TO ENTER SECONDARY ORDER FOR ACUTAL DIET ORDER.     Frequency: UNTIL DISCONTINUED     Number of Occurrences: Until Specified     Order Comments: If instructions require a change in diet, nursing needs to enter appropriate secondary diet order.      NURSING IF PATIENT NOT SEEN BY PHYSICAL THERAPY ON DAY OF SURGERY: 2 HRS AFTER SURGERY, STAND PATIENT AND WALK PATIENT 80 FEET.     Frequency: UNTIL DISCONTINUED     Number of Occurrences: Until Specified    PT IS HIGH RISK FOR VENOUS THROMBOEMBOLISM     Frequency: CONTINUOUS     Number of Occurrences: Until Specified    PT IS INTERMEDIATE RISK FOR VENOUS THROMBOEMBOLISM     Frequency: CONTINUOUS     Number of Occurrences: Until Specified    PULSE OXIMETRY Q4H     Frequency: Q4H     Number of Occurrences: Until Specified    TELEMETRY MONITORING - Continuous     Frequency: CONTINUOUS     Number of Occurrences: Until Specified    TRAPEZE TO BED     Frequency: CONTINUOUS     Number of Occurrences: Until Specified    VITAL SIGNS  Q4H     Frequency: Q4H     Number of Occurrences: Until Specified    VITAL SIGNS WITH PULSE OXIMETRY Q4HRS x 24HRS, THEN Q8HRS     Frequency: UNTIL DISCONTINUED     Number of Occurrences: Until Specified    WEIGH PATIENT     Frequency: DAILY (0600)     Number of Occurrences: Until Specified    WEIGH PATIENT     Frequency: UPON ADMISSION     Number of Occurrences: 1 Occurrences   Code Status    FULL CODE: ATTEMPT RESUSCITATION / CPR     Frequency: CONTINUOUS     Number of Occurrences: Until Specified     Order Comments: Patient wishes  for full ICU level care including advanced airway interventions / mechanical ventilation.     In the event of pulseless cardiac arrest, patient consents to ACLS (advanced cardiac life support) to attempt resuscitation.  IE - Consents to chest compressions, life support including intubation, mechanical ventilation, defibrillation/cardioversion as indicated.         Consult    IP CONSULT TO CARE MANAGEMENT     Frequency: ONE TIME     Number of Occurrences: 1 Occurrences    IP CONSULT TO ORTHOPEDICS Requested Provider; JOESPH KOCHER     Frequency: ONE TIME     Number of Occurrences: 1 Occurrences   OT    OT EVAL & TREAT     Frequency: Per Therapist Discretion     Number of Occurrences: 1 Occurrences     Scheduling Instructions:               PT    PT EVALUATE AND TREAT     Frequency: Per Therapist Discretion     Number of Occurrences: 1 Occurrences     Order Comments: Post Op Total Hip & Total Knee patients - Physical Therapy to start day of surgery if patient on nursing unit by 2pm.  Post Op Total Shoulder patients - Physical Therapy to start POD #1       Scheduling Instructions:               Respiratory Care    INCENTIVE SPIROMETRY - RT INSTRUCT  Frequency: ONE TIME     Number of Occurrences: 1 Occurrences    OXYGEN - NASAL CANNULA     Frequency: PRN     Number of Occurrences: Until Specified     Order Comments: Flowrate should not exeed 6L/min except [facility] facility.           OXYGEN WEANING PARAMETERS     Frequency: UNTIL DISCONTINUED     Number of Occurrences: Until Specified   Point of Care Testing    PERFORM POC WHOLE BLOOD GLUCOSE     Frequency: TID AC & HS     Number of Occurrences: Until Specified   Precaution    HIP PRECAUTIONS - NO HYPEREXTENSION, NO EXTERNAL ROTATION     Frequency: UNTIL DISCONTINUED     Number of Occurrences: Until Specified   Medications    acetaminophen  (TYLENOL ) tablet     Frequency: Q4H PRN     Dose: 650 mg     Route: Oral    alcohol  62 % (NOZIN NASAL SANITIZER) nasal swab packet      Frequency: 2x/day     Dose: 1 Each     Route: Each Nostril    aspirin  chewable tablet 81 mg     Frequency: 2x/day     Dose: 81 mg     Route: Oral    bisacodyl  (DULCOLAX) rectal suppository     Frequency: Daily PRN     Dose: 10 mg     Route: Rectal    cholecalciferol  (VITAMIN D3) 1000 unit (25 mcg) tablet     Frequency: Daily     Dose: 1,000 Units     Route: Oral    Correction/SSIP insulin  lispro 100 units/mL injection     Frequency: 4x/day AC     Dose: 2-9 Units     Route: Subcutaneous    dextrose  (GLUTOSE) 40% oral gel     Frequency: Q15 Min PRN     Dose: 15 g     Route: Oral    dextrose  50% (0.5 g/mL) injection - syringe     Frequency: Q15 Min PRN     Dose: 12.5 g     Route: Intravenous    dilTIAZem  (CARDIZEM  CD) 24 hr extended release capsule     Frequency: Daily     Dose: 240 mg     Route: Oral    diphenhydrAMINE  (BENADRYL ) 50 mg/mL injection     Frequency: Q6H PRN     Dose: 12.5 mg     Route: Intravenous    docusate sodium  (COLACE) capsule     Frequency: 2x/day     Dose: 100 mg     Route: Oral    donepezil  (ARICEPT ) tablet     Frequency: Daily     Dose: 10 mg     Route: Oral    ferrous sulfate  324 mg (65 mg elemental IRON ) tablet     Frequency: Daily before Breakfast     Dose: 324 mg     Route: Oral    glucagon  injection 1 mg     Frequency: Once PRN     Dose: 1 mg     Route: IntraMUSCULAR    guaiFENesin  (MUCINEX ) extended release tablet - for cough (expectorant)     Frequency: 2x/day     Dose: 600 mg     Route: Oral    heparin  5,000 unit/mL injection     Frequency: Q8HRS     Dose: 5,000 Units     Route: Subcutaneous  HYDROmorphone  (DILAUDID ) 2 mg/mL injection     Frequency: Q4H PRN     Dose: 0.2 mg     Route: Intravenous    HYDROmorphone  (DILAUDID ) 2 mg/mL injection     Frequency: Q4H PRN     Dose: 0.2 mg     Route: Intravenous    ipratropium-albuterol  0.5 mg-3 mg(2.5 mg base)/3 mL Solution for Nebulization     Frequency: Q4H PRN     Dose: 3 mL     Route: Nebulization    ketorolac  (TORADOL ) 30 mg/mL  injection     Frequency: Q8HRS     Dose: 15 mg     Route: Intravenous    LORazepam  (ATIVAN ) tablet     Frequency: 3x/day     Dose: 1 mg     Route: Oral    losartan  (COZAAR ) tablet     Frequency: Daily     Dose: 25 mg     Route: Oral    LR premix infusion     Frequency: Continuous     Route: Intravenous    Magic Mouthwash     Frequency: 3x/day PRN     Dose: 10 mL     Route: Swish & Spit    metoprolol  succinate (TOPROL -XL) 24 hr extended release tablet     Frequency: 2x/day     Dose: 25 mg     Route: Oral    montelukast  (SINGULAIR ) 10 mg tablet     Frequency: Daily     Dose: 10 mg     Route: Oral    morphine  2 mg/mL injection     Frequency: Q3H PRN     Dose: 2 mg     Route: Intravenous    morphine  2 mg/mL injection     Frequency: Q2H PRN     Dose: 2 mg     Route: Intravenous    multivitamin (THERA) tablet     Frequency: Daily     Dose: 1 Tablet     Route: Oral    naloxone  (NARCAN ) 0.4 mg/mL injection     Frequency: Q2 MIN PRN     Dose: 0.4 mg     Route: Intravenous    ondansetron  (ZOFRAN ) 2 mg/mL injection     Frequency: Q4H PRN     Dose: 4 mg     Route: Intravenous    oxyCODONE -acetaminophen  (PERCOCET) 5-325mg  per tablet     Frequency: Q4H PRN     Dose: 1 Tablet     Route: Oral    oxyCODONE -acetaminophen  (PERCOCET) 5-325mg  per tablet     Frequency: Q4H PRN     Dose: 2 Tablet     Route: Oral    pantoprazole  (PROTONIX ) delayed release tablet     Frequency: Daily     Dose: 40 mg     Route: Oral    pravastatin  (PRAVACHOL ) tablet     Frequency: QPM     Dose: 80 mg     Route: Oral    prochlorperazine  (COMPAZINE ) 5 mg/mL injection     Frequency: Q6H PRN     Dose: 10 mg     Route: Intravenous    sertraline  (ZOLOFT ) tablet     Frequency: Daily     Dose: 100 mg     Route: Oral    traMADol  (ULTRAM ) tablet     Frequency: Q6HRS     Dose: 50 mg     Route: Oral        Review of Systems:  Focused  review of system was completed. Refer to the HPI for ROS details.     Today's Physical Exam:  Physical Exam  Vitals reviewed.    Constitutional:       Appearance: Normal appearance.   HENT:      Head: Normocephalic and atraumatic.      Mouth/Throat:      Mouth: Mucous membranes are moist.   Eyes:      Extraocular Movements: Extraocular movements intact.   Cardiovascular:      Rate and Rhythm: Normal rate and regular rhythm.      Heart sounds: No murmur heard.  Pulmonary:      Effort: Pulmonary effort is normal.      Breath sounds: Normal breath sounds.      Comments: Wearing O2 NC  Abdominal:      General: Abdomen is flat.      Palpations: Abdomen is soft.      Tenderness: There is no abdominal tenderness.   Musculoskeletal:         General: Tenderness (Right hip) present.      Cervical back: Normal range of motion and neck supple.   Skin:     General: Skin is warm and dry.   Neurological:      Mental Status: She is alert and oriented to person, place, and time.   Psychiatric:         Mood and Affect: Mood normal.         Behavior: Behavior normal.          I/O:  I/O last 24 hours:    Intake/Output Summary (Last 24 hours) at 09/21/2023 1648  Last data filed at 09/21/2023 0939  Gross per 24 hour   Intake 240 ml   Output 100 ml   Net 140 ml     I/O current shift:  07/25 0700 - 07/25 1859  In: 240 [P.O.:240]  Out: -     Labs:  Reviewed: I have reviewed all lab results.  Lab Results Today:    Results for orders placed or performed during the hospital encounter of 09/18/23 (from the past 24 hours)   POC BLOOD GLUCOSE (RESULTS)   Result Value Ref Range    GLUCOSE, POC 175 (H) 70 - 100 mg/dl   POC BLOOD GLUCOSE (RESULTS)   Result Value Ref Range    GLUCOSE, POC 132 (H) 70 - 100 mg/dl   POC BLOOD GLUCOSE (RESULTS)   Result Value Ref Range    GLUCOSE, POC 128 (H) 70 - 100 mg/dl     Micro Results: No results found for any visits on 09/18/23 (from the past 24 hours).  Images:   No results found.     XR HIP RIGHT 1 VIEW   Final Result   Expected postoperative appearance of recent right hip arthroplasty                Radiologist location ID: [id number]          XR HIP RIGHT W PELVIS 2-3 VIEWS   Final Result   Acute displaced right femoral neck fracture                      Radiologist location ID: [id number]         XR AP MOBILE CHEST   Final Result   NEGATIVE CHEST            Radiologist location ID: [id number]  Problem List:  Active Hospital Problems   (*Primary Problem)    Diagnosis    *Fracture of right hip    Depression    Chronic kidney disease, stage 3 unspecified         Chronic obstructive pulmonary disease, unspecified         Essential hypertension    Type 2 diabetes mellitus         Mixed hyperlipidemia       Nutrition:   DIET REGULAR Do you want to initiate MNT Protocol? Yes             Additional clinical characteristics related to nutrition:    - monitor for weight changes   - monitor intake and output    - monitor bowel functions        Lab Results   Component Value Date    ALBUMIN 4.5 09/19/2023     Assessment/ Plan:   Fracture of the right hip  -s/p right hip surgery  -pain control  2. Depression/Anxiety  -home medications  3. HTN  -home meds  4. GERD   -protonix   5. Iron  deficiency  -home oral iron  replacement  6. Hyperlipidemia  -home pravastatin   7. Type 2 DM  -SSI       Hospitalist personally evaluated and examined the patient in conjunction with the MLP and agree with the assessments, treatment plan and disposition of the patient as recorded by the [facility].     DVT/PE Prophylaxis: Heparin     Discharge Planning: tbd    Recardo Hedger, PA

## 2023-09-21 NOTE — Progress Notes (Signed)
 Procedure(s), Procedure Date(s) and Post Op Day:    Procedure(s):  OPEN REDUCTION INTERNAL FIXATION RIGHT HIP FRACTURE USING BIPLOAR IMPLANTS    09/18/2023 - 09/19/2023    2 Days Post-Op  -------------------   Joesph Kocher, DO  Phone Number: [phone]       Gait Assessment/Treatment  Total Distance Ambulated: 12  Independence : moderate assist (50% patient effort)  Assistive Device : walker, front wheeled  Distance in Feet: from chair to the door  Gait Speed: slow and discontinuous  Deviations : cadence decreased, double stance time increased, lateral shift, limb motion velocity decreased, narrow BOS, step length decreased, stride length decreased, swing-to-stance ratio decreased, toe-to-floor clearance decreased, weight-shifting ability decreased  Safety Issues : cognitive deficits limit understanding, balance decreased during turns, sequencing ability decreased, step length decreased, weight-shifting ability decreased, loses balance backward, steps too close to front of assistive device  Impairments : cognition impaired, endurance, pain, strength decreased  Comment: cues for walker and foot placement  Anticoagulants (last 24 hours)       Date/Time Action Medication Dose    09/21/23 0604 Given    heparin  5,000 unit/mL injection 5,000 Units    09/20/23 2027 Given    heparin  5,000 unit/mL injection 5,000 Units           Patient Lines/Drains/Airways Status       Active Line / Dialysis Catheter / Dialysis Graft / Drain / Airway / Wound       Name Placement date Placement time Site Days    Wound  Incision Right Hip 09/19/23  1455  -- 1                   CBC:       \     /          /   \           BMP:            /               \         Pt comfortable.  DC plans for rehab.  Wound clean, dry  LLE, no calf swelling or pain.    Staable from ortho standpoint.

## 2023-09-21 NOTE — Care Management Notes (Signed)
 [facility]  Care Management Note    Patient Name: Karen Chase  Date of Birth: March 21, 1940  Sex: female  Date/Time of Admission: 09/18/2023  9:27 PM  Room/Bed: 374/A  Payor: [employer] MEDICARE / Plan: HU[employer] MEDICARE HMO / Product Type: MEDICARE [facility] /    LOS: 3 days   Primary Care Providers:  Christin Bailey LABOR, MD, MD (General)    Admitting Diagnosis:  Fracture of right hip [S72.001A]    Assessment:   CM faxed updated clinicals to Odette Mages, admissions coordinator for [facility], [place]. She has started serbia and will let CM know when insurance approves.    Discharge Plan:  SNF Placement (Medicare certified) (code 3)      The patient will continue to be evaluated for developing discharge needs.     Case Manager: Knox Ahle, [place]  Phone: [phone]

## 2023-09-21 NOTE — PT Treatment (Signed)
 [facility]  [address]  [place], [address]  [phone]  (Fax) [phone]  Rehabilitation Department  Physical Therapy Daily Inpatient Note    Date: 09/21/2023  Patient's Name: Karen Chase  Date of Birth: Jan 30, 1941  Height: Height: 157.5 cm (5' 2)  Weight: Weight: 76 kg (167 lb 8 oz)      Plan: Will continue under current POC.      Discharge Disposition: (P) skilled nursing facility        Subjective/Objective/Assessment:  Flowsheet    09/21/23 1402   Rehab Session   Document Type therapy progress note (daily note)   PT Visit Date 09/21/23   General Information   Patient Profile Reviewed yes   Medical Lines PIV Line;Telemetry   Respiratory Status room air   Existing Precautions/Restrictions posterior hip precautions   Pre Treatment Status   Pre Treatment Patient Status Patient sitting in bedside chair or w/c   Support Present Pre Treatment  Family present   Programmer, multimedia Nurse   Communication Pre Treatment Comment cleared   Pre-Treatment Pain   Pretreatment Pain Rating 4/10   Pre/Posttreatment Pain Comment right hip   Bed Mobility   Comment sitting to supine with mod of 2   Transfer Assessment/Treatment   Sit-Stand Independence minimum assist (75% patient effort);moderate assist (50% patient effort);2 person assist required   Stand-Sit Independence minimum assist (75% patient effort);2 person assist required   Sit-Stand-Sit, Assist Device walker, front wheeled   Toilet Transfer Independence minimum assist (75% patient effort);moderate assist (50% patient effort)   Toilet Transfer Assist Device walker, front wheeled   Transfer Impairments cognition impaired;balance impaired;endurance;pain;strength decreased   Gait Assessment/Treatment   Total Distance Ambulated 14   Independence  moderate assist (50% patient effort);1 person + 1 person to manage equipment   Assistive Device  walker, front wheeled   Comment assist needed for walker control,  cues for general safety,   Balance   Sitting Balance: Static good balance   Sitting, Dynamic (Balance) fair + balance   Sit-to-Stand Balance fair balance   Standing Balance: Static fair - balance   Standing Balance: Dynamic poor + balance   Post Treatment Status   Post Treatment Patient Status Patient safety alarm activated;Patient supine in bed   Support Present Post Treatment  Family present   Patient Effort adequate   Post-Treatment Pain   Posttreatment Pain Rating 4/10   Cognitive Assessment/Intervention   Behavior/Mood Observations behavior appropriate to situation, WNL/WFL   Physical Therapy Time and Intention   Total PT Minutes: 13   Therapy Plan Review/Discharge Plan (PT)   Anticipated Discharge Disposition skilled nursing facility     Patient in the chair, standing with mod of 2, gaited with rw 53ft with mod assist, cues and assist needed for walker control, stand erect, general safety, transferred on and off pottychair with min to mod of 2, returned to supine with mod to max of 2, need sin reach, bed alarmed, reviewed hip precautions, patient did recall one correctly          Goals:     scoot/bridge, supine to sit/sit to supine  supervision required, set up required       stand-by assistance  walker, rolling  50  with improved gait pattern, hip and knee flex         sit-to-stand/stand-to-sit, bed-to-chair/chair-to-bed  stand-by assistance  walker, rolling  Intervention minutes: GAIT TRAINING [address] MINUTES    THERAPIST  Orie Molt, PTA  09/21/2023, 14:48

## 2023-09-22 LAB — POC BLOOD GLUCOSE (RESULTS)
GLUCOSE, POC: 147 mg/dL — ABNORMAL HIGH (ref 70–100)
GLUCOSE, POC: 102 mg/dL — ABNORMAL HIGH (ref 70–100)
GLUCOSE, POC: 142 mg/dL — ABNORMAL HIGH (ref 70–100)
GLUCOSE, POC: 162 mg/dL — ABNORMAL HIGH (ref 70–100)

## 2023-09-22 MED ORDER — METOPROLOL SUCCINATE ER 25 MG TABLET,EXTENDED RELEASE 24 HR
25.0000 mg | ORAL_TABLET | Freq: Two times a day (BID) | ORAL | Status: DC
Start: 2023-09-22 — End: 2023-09-24
  Administered 2023-09-22 – 2023-09-24 (×6): 25 mg via ORAL
  Filled 2023-09-22 (×6): qty 1

## 2023-09-22 MED ORDER — POLYETHYLENE GLYCOL 3350 17 GRAM ORAL POWDER PACKET
17.0000 g | Freq: Every day | ORAL | Status: DC | PRN
Start: 2023-09-22 — End: 2023-09-24
  Administered 2023-09-22: 17 g via ORAL
  Filled 2023-09-22: qty 1

## 2023-09-22 MED ORDER — LOSARTAN 50 MG TABLET
25.0000 mg | ORAL_TABLET | Freq: Every day | ORAL | Status: DC
Start: 2023-09-22 — End: 2023-09-22

## 2023-09-22 MED ORDER — CLONIDINE HCL 0.2 MG TABLET
0.2000 mg | ORAL_TABLET | Freq: Three times a day (TID) | ORAL | Status: DC | PRN
Start: 2023-09-22 — End: 2023-09-24

## 2023-09-22 MED ORDER — LOSARTAN 50 MG TABLET
25.0000 mg | ORAL_TABLET | ORAL | Status: AC
Start: 2023-09-22 — End: 2023-09-22
  Administered 2023-09-22: 25 mg via ORAL
  Filled 2023-09-22: qty 1

## 2023-09-22 MED ORDER — LOSARTAN 50 MG TABLET
50.0000 mg | ORAL_TABLET | Freq: Every day | ORAL | Status: DC
Start: 2023-09-23 — End: 2023-09-23
  Administered 2023-09-23: 50 mg via ORAL
  Filled 2023-09-22: qty 1

## 2023-09-22 NOTE — Progress Notes (Signed)
 [facility]               IP PROGRESS NOTE      Karen Chase, Karen Chase  Date of Admission:  09/18/2023  Date of Birth:  01/23/1941  Date of Service:  09/22/2023    Hospital Day:  LOS: 4 days     Subjective:   Karen Chase is a 83 y.o. female with a known past medical history of Chronic Obstructive Pulmonary Disease, chronic kidney disease, hypertension, diabetes mellitus, and CAD presented to the ED with right hip pain after mechanical fall over door threshold. Patient was found to have right femoral neck fracture.  Orthopedics was consulted from the emergency department, agreeable to follow. Patient does normally follow with Dr.Qazi for cardiac problems.  No history of echo in our EMR.      09/19/23 - pt seen and examined at bedside. Home meds reviewed. Notes pain to the right leg and hip. Surgery scheduled for today. Not other complaints at this time.    09/20/23 - pt seen and examined at bedside. States pain is better since surgery yesterday, controlled at this time. OT at bedside. PT to evaluate.     09/21/23 - pt seen and examined at bedside. No acute complaints at this time. She has been able to bear some weight with PT. Waiting on SNF placement.     09/22/23 - pt seen and examined at bedside. BP has been elevated. Will add clonidine  prn. No acute complaints at this time.    Vital Signs:  Temp (24hrs) Max:37.4 C (99.4 F)      Temperature: 36.2 C (97.2 F)  BP (Non-Invasive): (!) 176/73  MAP (Non-Invasive): 101 mmHG  Heart Rate: 78  Respiratory Rate: 18  SpO2: 95 %    Current Medications:  acetaminophen  (TYLENOL ) tablet, 650 mg, Oral, Q4H PRN  alcohol  62 % (NOZIN NASAL SANITIZER) nasal swab packet, 1 Each, Each Nostril, 2x/day  aspirin  chewable tablet 81 mg, 81 mg, Oral, 2x/day  bisacodyl  (DULCOLAX) rectal suppository, 10 mg, Rectal, Daily PRN  cholecalciferol  (VITAMIN D3) 1000 unit (25 mcg) tablet, 1,000 Units, Oral, Daily  cloNIDine  (CATAPRES ) tablet, 0.2 mg, Oral, Q8H PRN  Correction/SSIP  insulin  lispro 100 units/mL injection, 2-9 Units, Subcutaneous, 4x/day AC  dextrose  (GLUTOSE) 40% oral gel, 15 g, Oral, Q15 Min PRN  dextrose  50% (0.5 g/mL) injection - syringe, 12.5 g, Intravenous, Q15 Min PRN  dilTIAZem  (CARDIZEM  CD) 24 hr extended release capsule, 240 mg, Oral, Daily  diphenhydrAMINE  (BENADRYL ) 50 mg/mL injection, 12.5 mg, Intravenous, Q6H PRN  docusate sodium  (COLACE) capsule, 100 mg, Oral, 2x/day  donepezil  (ARICEPT ) tablet, 10 mg, Oral, Daily  ferrous sulfate  324 mg (65 mg elemental IRON ) tablet, 324 mg, Oral, Daily before Breakfast  glucagon  injection 1 mg, 1 mg, IntraMUSCULAR, Once PRN  guaiFENesin  (MUCINEX ) extended release tablet - for cough (expectorant), 600 mg, Oral, 2x/day  heparin  5,000 unit/mL injection, 5,000 Units, Subcutaneous, Q8HRS  HYDROmorphone  (DILAUDID ) 2 mg/mL injection, 0.2 mg, Intravenous, Q4H PRN  HYDROmorphone  (DILAUDID ) 2 mg/mL injection, 0.2 mg, Intravenous, Q4H PRN  ipratropium-albuterol  0.5 mg-3 mg(2.5 mg base)/3 mL Solution for Nebulization, 3 mL, Nebulization, Q4H PRN  LORazepam  (ATIVAN ) tablet, 1 mg, Oral, 3x/day  [START ON 09/23/2023] losartan  (COZAAR ) tablet, 50 mg, Oral, Daily  LR premix infusion, , Intravenous, Continuous  Magic Mouthwash, 10 mL, Swish & Spit, 3x/day PRN  metoprolol  succinate (TOPROL -XL) 24 hr extended release tablet, 25 mg, Oral, 2x/day  montelukast  (SINGULAIR ) 10 mg tablet, 10 mg, Oral,  Daily  morphine  2 mg/mL injection, 2 mg, Intravenous, Q3H PRN  morphine  2 mg/mL injection, 2 mg, Intravenous, Q2H PRN  multivitamin (THERA) tablet, 1 Tablet, Oral, Daily  naloxone  (NARCAN ) 0.4 mg/mL injection, 0.4 mg, Intravenous, Q2 MIN PRN  ondansetron  (ZOFRAN ) 2 mg/mL injection, 4 mg, Intravenous, Q4H PRN  oxyCODONE -acetaminophen  (PERCOCET) 5-325mg  per tablet, 1 Tablet, Oral, Q4H PRN  oxyCODONE -acetaminophen  (PERCOCET) 5-325mg  per tablet, 2 Tablet, Oral, Q4H PRN  pantoprazole  (PROTONIX ) delayed release tablet, 40 mg, Oral, Daily  polyethylene glycol  (MIRALAX ) oral packet, 17 g, Oral, Daily PRN  pravastatin  (PRAVACHOL ) tablet, 80 mg, Oral, QPM  prochlorperazine  (COMPAZINE ) 5 mg/mL injection, 10 mg, Intravenous, Q6H PRN  sertraline  (ZOLOFT ) tablet, 100 mg, Oral, Daily        Current Orders:  Active Orders   Diet    DIET REGULAR Do you want to initiate MNT Protocol? Yes     Frequency: All Meals     Number of Occurrences: 1 Occurrences   Nursing    ACTIVITY     Frequency: UNTIL DISCONTINUED     Number of Occurrences: Until Specified    BLADDER SCAN IF PATIENT HAS NOT VOIDED WITHIN 4 HOURS     Frequency: Q4H PRN     Number of Occurrences: Until Specified    CHANGE OUTER DRESSING ONLY     Frequency: PRN     Number of Occurrences: Until Specified    COLD THERAPY     Frequency: UNTIL DISCONTINUED     Number of Occurrences: Until Specified    ENCOURAGE FREQUENT ANKLE/FOOT MOTION Q1HR WHILE AWAKE     Frequency: Q1H     Number of Occurrences: Until Specified    HEELS OFF BED/UTILIZE HEEL RELIEF FUNCTION ON BED     Frequency: CONTINUOUS     Number of Occurrences: Until Specified    HYPOGLYCEMIA MANAGEMENT - CONSCIOUS PATIENT W/DIET ORDER     Frequency: UNTIL DISCONTINUED     Number of Occurrences: Until Specified    HYPOGLYCEMIA MANAGEMENT - UNCONSCIOUS/ALTERED/NPO PATIENT     Frequency: UNTIL DISCONTINUED     Number of Occurrences: Until Specified    HYPOGLYCEMIA TREATMENT ALGORITHM     Frequency: UNTIL DISCONTINUED     Number of Occurrences: Until Specified    INCENTIVE SPIROMETRY NURSING     Frequency: Q1H WA     Number of Occurrences: 250 Occurrences    INTAKE AND OUTPUT QSHIFT     Frequency: QSHIFT     Number of Occurrences: Until Specified    INTAKE AND OUTPUT QSHIFT     Frequency: QSHIFT     Number of Occurrences: Until Specified    KEEP FOOT OF BED FLAT.  DO NOT PUT PILLOW UNDER KNEE     Frequency: UNTIL DISCONTINUED     Number of Occurrences: Until Specified    Notify MD Vital Signs     Frequency: PRN     Number of Occurrences: Until Specified    Notify MD Vital  Signs     Frequency: PRN     Number of Occurrences: Until Specified     Order Comments: Notify MD if urine output is less than 500 mL 8 hours after surgery.      NURSING DIET INFORMATION (NOT A DIET ORDER) - ADVANCE DIET AS TOLERATED.  NURSING TO ENTER SECONDARY ORDER FOR ACUTAL DIET ORDER.     Frequency: UNTIL DISCONTINUED     Number of Occurrences: Until Specified     Order Comments: If instructions require a  change in diet, nursing needs to enter appropriate secondary diet order.      NURSING IF PATIENT NOT SEEN BY PHYSICAL THERAPY ON DAY OF SURGERY: 2 HRS AFTER SURGERY, STAND PATIENT AND WALK PATIENT 80 FEET.     Frequency: UNTIL DISCONTINUED     Number of Occurrences: Until Specified    PT IS HIGH RISK FOR VENOUS THROMBOEMBOLISM     Frequency: CONTINUOUS     Number of Occurrences: Until Specified    PT IS INTERMEDIATE RISK FOR VENOUS THROMBOEMBOLISM     Frequency: CONTINUOUS     Number of Occurrences: Until Specified    PULSE OXIMETRY Q4H     Frequency: Q4H     Number of Occurrences: Until Specified    TELEMETRY MONITORING - Continuous     Frequency: CONTINUOUS     Number of Occurrences: Until Specified    TRAPEZE TO BED     Frequency: CONTINUOUS     Number of Occurrences: Until Specified    VITAL SIGNS  Q4H     Frequency: Q4H     Number of Occurrences: Until Specified    VITAL SIGNS WITH PULSE OXIMETRY Q4HRS x 24HRS, THEN Q8HRS     Frequency: UNTIL DISCONTINUED     Number of Occurrences: Until Specified    WEIGH PATIENT     Frequency: DAILY (0600)     Number of Occurrences: Until Specified    WEIGH PATIENT     Frequency: UPON ADMISSION     Number of Occurrences: 1 Occurrences   Code Status    FULL CODE: ATTEMPT RESUSCITATION / CPR     Frequency: CONTINUOUS     Number of Occurrences: Until Specified     Order Comments: Patient wishes for full ICU level care including advanced airway interventions / mechanical ventilation.     In the event of pulseless cardiac arrest, patient consents to ACLS (advanced cardiac  life support) to attempt resuscitation.  IE - Consents to chest compressions, life support including intubation, mechanical ventilation, defibrillation/cardioversion as indicated.         Consult    IP CONSULT TO CARE MANAGEMENT     Frequency: ONE TIME     Number of Occurrences: 1 Occurrences    IP CONSULT TO ORTHOPEDICS Requested Provider; JOESPH KOCHER     Frequency: ONE TIME     Number of Occurrences: 1 Occurrences   OT    OT EVAL & TREAT     Frequency: Per Therapist Discretion     Number of Occurrences: 1 Occurrences     Scheduling Instructions:               PT    PT EVALUATE AND TREAT     Frequency: Per Therapist Discretion     Number of Occurrences: 1 Occurrences     Order Comments: Post Op Total Hip & Total Knee patients - Physical Therapy to start day of surgery if patient on nursing unit by 2pm.  Post Op Total Shoulder patients - Physical Therapy to start POD #1       Scheduling Instructions:               Respiratory Care    INCENTIVE SPIROMETRY - RT INSTRUCT     Frequency: ONE TIME     Number of Occurrences: 1 Occurrences    OXYGEN - NASAL CANNULA     Frequency: PRN     Number of Occurrences: Until Specified     Order Comments: Flowrate  should not exeed 6L/min except [facility] facility.           OXYGEN WEANING PARAMETERS     Frequency: UNTIL DISCONTINUED     Number of Occurrences: Until Specified   Point of Care Testing    PERFORM POC WHOLE BLOOD GLUCOSE     Frequency: TID AC & HS     Number of Occurrences: Until Specified   Precaution    HIP PRECAUTIONS - NO HYPEREXTENSION, NO EXTERNAL ROTATION     Frequency: UNTIL DISCONTINUED     Number of Occurrences: Until Specified   Medications    acetaminophen  (TYLENOL ) tablet     Frequency: Q4H PRN     Dose: 650 mg     Route: Oral    alcohol  62 % (NOZIN NASAL SANITIZER) nasal swab packet     Frequency: 2x/day     Dose: 1 Each     Route: Each Nostril    aspirin  chewable tablet 81 mg     Frequency: 2x/day     Dose: 81 mg     Route: Oral    bisacodyl  (DULCOLAX)  rectal suppository     Frequency: Daily PRN     Dose: 10 mg     Route: Rectal    cholecalciferol  (VITAMIN D3) 1000 unit (25 mcg) tablet     Frequency: Daily     Dose: 1,000 Units     Route: Oral    cloNIDine  (CATAPRES ) tablet     Frequency: Q8H PRN     Dose: 0.2 mg     Route: Oral    Correction/SSIP insulin  lispro 100 units/mL injection     Frequency: 4x/day AC     Dose: 2-9 Units     Route: Subcutaneous    dextrose  (GLUTOSE) 40% oral gel     Frequency: Q15 Min PRN     Dose: 15 g     Route: Oral    dextrose  50% (0.5 g/mL) injection - syringe     Frequency: Q15 Min PRN     Dose: 12.5 g     Route: Intravenous    dilTIAZem  (CARDIZEM  CD) 24 hr extended release capsule     Frequency: Daily     Dose: 240 mg     Route: Oral    diphenhydrAMINE  (BENADRYL ) 50 mg/mL injection     Frequency: Q6H PRN     Dose: 12.5 mg     Route: Intravenous    docusate sodium  (COLACE) capsule     Frequency: 2x/day     Dose: 100 mg     Route: Oral    donepezil  (ARICEPT ) tablet     Frequency: Daily     Dose: 10 mg     Route: Oral    ferrous sulfate  324 mg (65 mg elemental IRON ) tablet     Frequency: Daily before Breakfast     Dose: 324 mg     Route: Oral    glucagon  injection 1 mg     Frequency: Once PRN     Dose: 1 mg     Route: IntraMUSCULAR    guaiFENesin  (MUCINEX ) extended release tablet - for cough (expectorant)     Frequency: 2x/day     Dose: 600 mg     Route: Oral    heparin  5,000 unit/mL injection     Frequency: Q8HRS     Dose: 5,000 Units     Route: Subcutaneous    HYDROmorphone  (DILAUDID ) 2 mg/mL injection     Frequency: Q4H PRN  Dose: 0.2 mg     Route: Intravenous    HYDROmorphone  (DILAUDID ) 2 mg/mL injection     Frequency: Q4H PRN     Dose: 0.2 mg     Route: Intravenous    ipratropium-albuterol  0.5 mg-3 mg(2.5 mg base)/3 mL Solution for Nebulization     Frequency: Q4H PRN     Dose: 3 mL     Route: Nebulization    LORazepam  (ATIVAN ) tablet     Frequency: 3x/day     Dose: 1 mg     Route: Oral    losartan  (COZAAR ) tablet     Frequency:  Daily     Dose: 50 mg     Route: Oral    LR premix infusion     Frequency: Continuous     Route: Intravenous    Magic Mouthwash     Frequency: 3x/day PRN     Dose: 10 mL     Route: Swish & Spit    metoprolol  succinate (TOPROL -XL) 24 hr extended release tablet     Frequency: 2x/day     Dose: 25 mg     Route: Oral    montelukast  (SINGULAIR ) 10 mg tablet     Frequency: Daily     Dose: 10 mg     Route: Oral    morphine  2 mg/mL injection     Frequency: Q3H PRN     Dose: 2 mg     Route: Intravenous    morphine  2 mg/mL injection     Frequency: Q2H PRN     Dose: 2 mg     Route: Intravenous    multivitamin (THERA) tablet     Frequency: Daily     Dose: 1 Tablet     Route: Oral    naloxone  (NARCAN ) 0.4 mg/mL injection     Frequency: Q2 MIN PRN     Dose: 0.4 mg     Route: Intravenous    ondansetron  (ZOFRAN ) 2 mg/mL injection     Frequency: Q4H PRN     Dose: 4 mg     Route: Intravenous    oxyCODONE -acetaminophen  (PERCOCET) 5-325mg  per tablet     Frequency: Q4H PRN     Dose: 1 Tablet     Route: Oral    oxyCODONE -acetaminophen  (PERCOCET) 5-325mg  per tablet     Frequency: Q4H PRN     Dose: 2 Tablet     Route: Oral    pantoprazole  (PROTONIX ) delayed release tablet     Frequency: Daily     Dose: 40 mg     Route: Oral    polyethylene glycol (MIRALAX ) oral packet     Frequency: Daily PRN     Dose: 17 g     Route: Oral    pravastatin  (PRAVACHOL ) tablet     Frequency: QPM     Dose: 80 mg     Route: Oral    prochlorperazine  (COMPAZINE ) 5 mg/mL injection     Frequency: Q6H PRN     Dose: 10 mg     Route: Intravenous    sertraline  (ZOLOFT ) tablet     Frequency: Daily     Dose: 100 mg     Route: Oral        Review of Systems:  Focused review of system was completed. Refer to the HPI for ROS details.     Today's Physical Exam:  Physical Exam  Vitals reviewed.   Constitutional:       Appearance: Normal appearance.   HENT:  Head: Normocephalic and atraumatic.      Mouth/Throat:      Mouth: Mucous membranes are moist.   Eyes:      Extraocular  Movements: Extraocular movements intact.   Cardiovascular:      Rate and Rhythm: Normal rate and regular rhythm.      Heart sounds: No murmur heard.  Pulmonary:      Effort: Pulmonary effort is normal.      Breath sounds: Normal breath sounds.      Comments: On RA  Abdominal:      General: Abdomen is flat.      Palpations: Abdomen is soft.      Tenderness: There is no abdominal tenderness.   Musculoskeletal:         General: Tenderness (Right hip) present.      Cervical back: Normal range of motion and neck supple.   Skin:     General: Skin is warm and dry.   Neurological:      Mental Status: She is alert and oriented to person, place, and time.   Psychiatric:         Mood and Affect: Mood normal.         Behavior: Behavior normal.          I/O:  I/O last 24 hours:    Intake/Output Summary (Last 24 hours) at 09/22/2023 1434  Last data filed at 09/22/2023 0500  Gross per 24 hour   Intake 240 ml   Output 100 ml   Net 140 ml     I/O current shift:  No intake/output data recorded.    Labs:  Reviewed: I have reviewed all lab results.  Lab Results Today:    Results for orders placed or performed during the hospital encounter of 09/18/23 (from the past 24 hours)   POC BLOOD GLUCOSE (RESULTS)   Result Value Ref Range    GLUCOSE, POC 187 (H) 70 - 100 mg/dl   URINALYSIS, MACROSCOPIC   Result Value Ref Range    COLOR Yellow Colorless, Light Yellow, Yellow    APPEARANCE Clear Clear    SPECIFIC GRAVITY 1.006 1.002 - 1.030    PH 6.0 5.0 - 9.0    LEUKOCYTES Negative Negative, 100  WBCs/uL    NITRITE Negative Negative    PROTEIN Negative Negative, 10 , 20  mg/dL    GLUCOSE Negative Negative, 30  mg/dL    KETONES Negative Negative, Trace mg/dL    BILIRUBIN Negative Negative, 0.5 mg/dL    BLOOD Negative Negative, 0.03 mg/dL    UROBILINOGEN Normal Normal mg/dL   URINALYSIS, MICROSCOPIC   Result Value Ref Range    RBCS      WBCS 1 <6 /hpf    SQUAMOUS EPITHELIAL 1 <28 /hpf   POC BLOOD GLUCOSE (RESULTS)   Result Value Ref Range    GLUCOSE,  POC 147 (H) 70 - 100 mg/dl   POC BLOOD GLUCOSE (RESULTS)   Result Value Ref Range    GLUCOSE, POC 102 (H) 70 - 100 mg/dl     Micro Results: No results found for any visits on 09/18/23 (from the past 24 hours).  Images:   No results found.     XR HIP RIGHT 1 VIEW   Final Result   Expected postoperative appearance of recent right hip arthroplasty                Radiologist location ID: [id number]         XR HIP RIGHT  W PELVIS 2-3 VIEWS   Final Result   Acute displaced right femoral neck fracture                      Radiologist location ID: [id number]         XR AP MOBILE CHEST   Final Result   NEGATIVE CHEST            Radiologist location ID: [id number]              Problem List:  Active Hospital Problems   (*Primary Problem)    Diagnosis    *Fracture of right hip    Depression    Chronic kidney disease, stage 3 unspecified         Chronic obstructive pulmonary disease, unspecified         Essential hypertension    Type 2 diabetes mellitus         Mixed hyperlipidemia       Nutrition:   DIET REGULAR Do you want to initiate MNT Protocol? Yes             Additional clinical characteristics related to nutrition:    - monitor for weight changes   - monitor intake and output    - monitor bowel functions        Lab Results   Component Value Date    ALBUMIN 4.5 09/19/2023     Assessment/ Plan:   Fracture of the right hip  -s/p right hip surgery  -pain control  2. Depression/Anxiety  -home medications  3. HTN  -home meds  -BP has been elevated, likely pain related   -increase losartan  to 50 mg daily   -add clonidine  prn   4. GERD   -protonix   5. Iron  deficiency  -home oral iron  replacement  6. Hyperlipidemia  -home pravastatin   7. Type 2 DM  -SSI       Hospitalist personally evaluated and examined the patient in conjunction with the MLP and agree with the assessments, treatment plan and disposition of the patient as recorded by the [facility].     DVT/PE Prophylaxis: Heparin     Discharge Planning: tbd    Recardo Hedger,  PA

## 2023-09-22 NOTE — Nurses Notes (Signed)
 Notified by primary nurse of blood pressure of 184/85 and 172/70, metoprolol  to be restarted in the morning, obtained order from P.Goins, NP to start home dose metoprolol  now due to increased blood pressure

## 2023-09-22 NOTE — Care Plan (Signed)
 Problem: Adult Inpatient Plan of Care  Goal: Plan of Care Review  Outcome: Ongoing (see interventions/notes)  Goal: Patient-Specific Goal (Individualized)  Outcome: Ongoing (see interventions/notes)  Goal: Absence of Hospital-Acquired Illness or Injury  Outcome: Ongoing (see interventions/notes)  Intervention: Identify and Manage Fall Risk  Recent Flowsheet Documentation  Taken 09/22/2023 0000 by Rexene DEL, RN  Safety Promotion/Fall Prevention:   safety round/check completed   nonskid shoes/slippers when out of bed   fall prevention program maintained  Taken 09/21/2023 2200 by Rexene DEL, RN  Safety Promotion/Fall Prevention:   safety round/check completed   nonskid shoes/slippers when out of bed   fall prevention program maintained  Taken 09/21/2023 2000 by Rexene DEL, RN  Safety Promotion/Fall Prevention:   safety round/check completed   nonskid shoes/slippers when out of bed   fall prevention program maintained  Taken 09/21/2023 1951 by Rexene DEL, RN  Safety Promotion/Fall Prevention:   safety round/check completed   nonskid shoes/slippers when out of bed   fall prevention program maintained  Intervention: Prevent Skin Injury  Recent Flowsheet Documentation  Taken 09/21/2023 2200 by Rexene DEL, RN  Body Position: side lying, right  Taken 09/21/2023 2000 by Rexene DEL, RN  Body Position: side lying, left  Taken 09/21/2023 1951 by Rexene DEL, RN  Skin Protection: adhesive use limited  Intervention: Prevent and Manage VTE (Venous Thromboembolism) Risk  Recent Flowsheet Documentation  Taken 09/21/2023 1951 by Rexene DEL, RN  VTE Prevention/Management: anticoagulant therapy maintained  Intervention: Prevent Infection  Recent Flowsheet Documentation  Taken 09/22/2023 0000 by Rexene DEL, RN  Infection Prevention:   single patient room provided   rest/sleep promoted   promote handwashing  Taken 09/21/2023 2200 by Rexene DEL, RN  Infection Prevention:   single patient room provided   rest/sleep promoted   promote handwashing  Taken 09/21/2023 2000 by Rexene DEL,  RN  Infection Prevention:   single patient room provided   rest/sleep promoted   promote handwashing  Taken 09/21/2023 1951 by Rexene DEL, RN  Infection Prevention:   single patient room provided   rest/sleep promoted   promote handwashing  Goal: Optimal Comfort and Wellbeing  Outcome: Ongoing (see interventions/notes)  Goal: Rounds/Family Conference  Outcome: Ongoing (see interventions/notes)     Problem: Health Knowledge, Opportunity to Enhance (Adult,Obstetrics,Pediatric)  Goal: Knowledgeable about Health Subject/Topic  Description: Patient will demonstrate the desired outcomes by discharge/transition of care.  Outcome: Ongoing (see interventions/notes)     Problem: Skin Injury Risk Increased  Goal: Skin Health and Integrity  Outcome: Ongoing (see interventions/notes)  Intervention: Optimize Skin Protection  Recent Flowsheet Documentation  Taken 09/21/2023 1951 by Rexene DEL, RN  Pressure Reduction Techniques: Frequent weight shifting encouraged  Pressure Reduction Devices: Repositioning wedges/pillows utilized  Skin Protection: adhesive use limited     Problem: Fall Injury Risk  Goal: Absence of Fall and Fall-Related Injury  Outcome: Ongoing (see interventions/notes)  Intervention: Promote Scientist, clinical (histocompatibility and immunogenetics) Documentation  Taken 09/22/2023 0000 by Rexene DEL, RN  Safety Promotion/Fall Prevention:   safety round/check completed   nonskid shoes/slippers when out of bed   fall prevention program maintained  Taken 09/21/2023 2200 by Rexene DEL, RN  Safety Promotion/Fall Prevention:   safety round/check completed   nonskid shoes/slippers when out of bed   fall prevention program maintained  Taken 09/21/2023 2000 by Rexene DEL, RN  Safety Promotion/Fall Prevention:   safety round/check completed   nonskid shoes/slippers when out of bed   fall prevention program maintained  Taken 09/21/2023  1951 by Rexene DEL, RN  Safety Promotion/Fall Prevention:   safety round/check completed   nonskid shoes/slippers when out of bed   fall  prevention program maintained     Problem: Orthopaedic Fracture  Goal: Absence of Bleeding  Outcome: Ongoing (see interventions/notes)  Goal: Bowel Elimination  Outcome: Ongoing (see interventions/notes)  Goal: Absence of Embolism Signs and Symptoms  Outcome: Ongoing (see interventions/notes)  Intervention: Prevent or Manage Embolism Risk  Recent Flowsheet Documentation  Taken 09/21/2023 1951 by Rexene DEL, RN  VTE Prevention/Management: anticoagulant therapy maintained  Goal: Fracture Stability  Outcome: Ongoing (see interventions/notes)  Goal: Optimal Functional Ability  Outcome: Ongoing (see interventions/notes)  Goal: Absence of Infection Signs and Symptoms  Outcome: Ongoing (see interventions/notes)  Intervention: Prevent or Manage Infection  Recent Flowsheet Documentation  Taken 09/21/2023 1951 by Rexene DEL, RN  Fever Reduction/Comfort Measures:   lightweight clothing   lightweight bedding  Goal: Effective Tissue Perfusion  Outcome: Ongoing (see interventions/notes)  Goal: Optimal Pain Control and Function  Outcome: Ongoing (see interventions/notes)  Goal: Effective Oxygenation and Ventilation  Outcome: Ongoing (see interventions/notes)     Problem: Wound  Goal: Optimal Coping  Outcome: Ongoing (see interventions/notes)  Goal: Optimal Functional Ability  Outcome: Ongoing (see interventions/notes)  Goal: Absence of Infection Signs and Symptoms  Outcome: Ongoing (see interventions/notes)  Intervention: Prevent or Manage Infection  Recent Flowsheet Documentation  Taken 09/21/2023 1951 by Rexene DEL, RN  Fever Reduction/Comfort Measures:   lightweight clothing   lightweight bedding  Goal: Improved Oral Intake  Outcome: Ongoing (see interventions/notes)  Goal: Optimal Pain Control and Function  Outcome: Ongoing (see interventions/notes)  Goal: Skin Health and Integrity  Outcome: Ongoing (see interventions/notes)  Intervention: Optimize Skin Protection  Recent Flowsheet Documentation  Taken 09/21/2023 1951 by Rexene DEL, RN  Pressure  Reduction Techniques: Frequent weight shifting encouraged  Pressure Reduction Devices: Repositioning wedges/pillows utilized  Goal: Optimal Wound Healing  Outcome: Ongoing (see interventions/notes)  Intervention: Promote Wound Healing  Recent Flowsheet Documentation  Taken 09/21/2023 1951 by Rexene DEL, RN  Pressure Reduction Techniques: Frequent weight shifting encouraged  Pressure Reduction Devices: Repositioning wedges/pillows utilized

## 2023-09-22 NOTE — PT Treatment (Signed)
 [facility]  [address]  [place], [address]  [phone]  (Fax) [phone]  Rehabilitation Department  Physical Therapy Daily Inpatient Note    Date: 09/22/2023  Patient's Name: Karen Chase  Date of Birth: [birth year]  Height: Height: 157.5 cm (5' 2)  Weight: Weight: 72.3 kg (159 lb 7 oz)      Plan: Will continue under current POC.      Discharge Disposition: skilled nursing facility        Subjective/Objective/Assessment:  Flowsheet    09/22/23 1045   Rehab Session   Document Type therapy progress note (daily note)   PT Visit Date 09/22/23   Total PT Minutes: 50   Patient Effort good   General Information   Patient Profile Reviewed yes   Medical Lines PIV Line;Telemetry   Respiratory Status room air   Existing Precautions/Restrictions posterior hip precautions;full code;fall precautions   Pre Treatment Status   Pre Treatment Patient Status Patient sitting in bedside chair or w/c;Call light within reach;Patient safety alarm activated;Nurse approved session   Support Present Pre Treatment  None   Cognition   Behavior/Mood Observations behavior appropriate to situation, WNL/WFL   Vital Signs   Post-treatment Heart Rate (beats/min) 74   Post SpO2 (%) 94   O2 Delivery Post Treatment room air   Vitals Comment not connected at beginning of session.   Pain Assessment   Pretreatment Pain Rating 6/10   Posttreatment Pain Rating 6/10   Pre/Posttreatment Pain Comment Bilateral arm/ shoulder    Transfer Assessment/Treatment   Sit-Stand Independence minimum assist (75% patient effort)   Stand-Sit Independence minimum assist (75% patient effort)   Sit-Stand-Sit, Assist Device walker, front wheeled   Toilet Transfer Independence minimum assist (75% patient effort)   Toilet Transfer Assist Device walker, front wheeled   Transfer Impairments strength decreased;pain   Gait Assessment/Treatment   Total Distance Ambulated 65   Independence  minimum assist (75% patient effort)    Assistive Device  walker, front wheeled   Comment Patient required max verbal cues to stay back from front of RW.   Balance   Sitting Balance: Static good balance   Sitting, Dynamic (Balance) fair + balance   Sit-to-Stand Balance fair balance   Standing Balance: Static fair balance   Standing Balance: Dynamic fair - balance   Post Treatment Status   Post Treatment Patient Status Patient safety alarm activated;Call light within reach;Patient sitting in bedside chair or w/c   Support Present Post Treatment  None   Physical Therapy Clinical Impression   Assessment Patient is able to scoot to the front of her chair with verbal cues. She stands with min assist. She gaits with RW and min assist. She requires one sitting rest break for gait distance. She gaits with a step to, antalgic pattern. She does requires max verbal cues to stay back from front of RW. She returns to room via chair and reports she needs to use restroom. She stands from chair with min assist and transfers to bedside toilet with min assist. She voids then transfers back to chair with min assist. Chair check activated and needs in reach.   Anticipated Discharge Disposition skilled nursing facility               Goals:     scoot/bridge, supine to sit/sit to supine  supervision required, set up required       stand-by assistance  walker, rolling  50  with improved gait pattern, hip  and knee flex         sit-to-stand/stand-to-sit, bed-to-chair/chair-to-bed  stand-by assistance  walker, rolling                     Intervention minutes: THERAPEUTIC ACTIVITY 15 minutes and GAIT TRAINING 35 MINUTES    THERAPIST  Jontavious Commons, PTA  09/22/2023, 12:48

## 2023-09-22 NOTE — Progress Notes (Signed)
 ORTHOPAEDIC SURGERY PROGRESS NOTE    Patient Name: Karen Chase   MRN: [id number]   Date of Birth: [birth year]  Date: 09/22/2023  Age: 83 y.o.    Gender: female  Attending Physician: Claudene Register, DO    Subjective:    HPI:  Karen Chase is a 83 y.o. 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  Patient is up and alert eating breakfast this morning.  Does complain of some soreness of the right thigh.  She also states she is having some trouble with constipation.    Objective:    Last Filed Vitals:  Filed Vitals:    09/22/23 0001 09/22/23 0353 09/22/23 0725 09/22/23 0838   BP: (!) 172/70 (!) 176/79 (!) 176/73 (!) 176/73   Pulse:  69 77 77   Resp:  16 18    Temp:  36.8 C (98.2 F) 36.2 C (97.2 F)    SpO2:  96% 98%         Musculoskeletal Exam:     Examination of the operative extremity demontrates:    The patient is alert and oriented x3   Right hip incisions healing well without redness drainage signs of infection no pain to palpation calf Homans sign negative    Radiographs:  Results for orders placed or performed during the hospital encounter of 09/18/23 (from the past 72 hours)   XR HIP RIGHT 1 VIEW     Status: None    Narrative    [name]    RADIOLOGIST: Lonni Debby [place], MD    XR HIP RIGHT 1 VIEW performed on 09/19/2023 4:19 PM    CLINICAL HISTORY: post op.  post op hip replacement    TECHNIQUE:  1 view of the right hip    COMPARISON:  09/18/2023      FINDINGS:   Postoperative changes are seen arthroplasty are present. Lateral soft tissue swelling and skin staples are present.           Impression    Expected postoperative appearance of recent right hip arthroplasty           Radiologist location ID: [id number]          Lab Data   Lab Results   Component Value Date    WBC 10.2 09/20/2023    HGB 11.9 09/20/2023    HCT 33.6 09/20/2023     Lab Results   Component Value Date    APTT 29.2 09/18/2023    INR 1.04 09/18/2023       Microbiology:      INFLUENZA VIRUS TYPE A    Date Value Ref Range Status   04/14/2019 Not Detected Not Detected Final     INFLUENZA VIRUS TYPE B   Date Value Ref Range Status   04/14/2019 Not Detected Not Detected Final           Impression and Plan:    Impression:   Postop right bipolar hip arthroplasty     Plan:  Currently waiting to be discharge to facility.      Cordella Hopkins, PA-C  09/22/23 08:53  [facility]  Please call with any questions/concerns     This note has been created with voice recognition software.  Please excuse any errors in transcription.  Occasional wrong word or sound alike substitutions may have occurred due to the inherent limitations of voice recognition software.  Please read the chart carefully and recognize using context with the substitutions may have  occurred.

## 2023-09-23 LAB — BASIC METABOLIC PANEL
ANION GAP: 9 mmol/L (ref 4–13)
BUN/CREA RATIO: 15 (ref 6–22)
BUN: 14 mg/dL (ref 7–25)
CALCIUM: 9.1 mg/dL (ref 8.6–10.3)
CHLORIDE: 101 mmol/L (ref 98–107)
CO2 TOTAL: 26 mmol/L (ref 21–31)
CREATININE: 0.91 mg/dL (ref 0.60–1.30)
ESTIMATED GFR: 63 mL/min/1.73mˆ2 (ref >59)
GLUCOSE: 159 mg/dL — ABNORMAL HIGH (ref 74–109)
OSMOLALITY, CALCULATED: 276 mosm/kg (ref 270–290)
POTASSIUM: 4.1 mmol/L (ref 3.5–5.1)
SODIUM: 136 mmol/L (ref 136–145)

## 2023-09-23 LAB — CBC WITH DIFF
BASOPHIL #: 0 x10ˆ3/uL (ref 0.00–0.10)
BASOPHIL %: 0 % (ref 0–1)
EOSINOPHIL #: 0.2 x10ˆ3/uL (ref 0.00–0.50)
EOSINOPHIL %: 3 % (ref 1–7)
HCT: 30.9 % — ABNORMAL LOW (ref 31.2–41.9)
HGB: 11 g/dL (ref 10.9–14.3)
LYMPHOCYTE #: 1 x10ˆ3/uL — ABNORMAL LOW (ref 1.10–3.10)
LYMPHOCYTE %: 16 % (ref 16–46)
MCH: 31.6 pg (ref 24.7–32.8)
MCHC: 35.7 g/dL — ABNORMAL HIGH (ref 32.3–35.6)
MCV: 88.7 fL (ref 75.5–95.3)
MONOCYTE #: 0.6 x10ˆ3/uL (ref 0.20–0.90)
MONOCYTE %: 9 % (ref 4–11)
MPV: 7.1 fL — ABNORMAL LOW (ref 7.9–10.8)
NEUTROPHIL #: 4.6 x10ˆ3/uL (ref 1.90–8.20)
NEUTROPHIL %: 72 % (ref 43–77)
PLATELETS: 162 x10ˆ3/uL (ref 140–440)
RBC: 3.49 x10ˆ6/uL — ABNORMAL LOW (ref 3.63–4.92)
RDW: 13.1 % (ref 12.3–17.7)
WBC: 6.4 x10ˆ3/uL (ref 3.8–11.8)

## 2023-09-23 LAB — POC BLOOD GLUCOSE (RESULTS)
GLUCOSE, POC: 160 mg/dL — ABNORMAL HIGH (ref 70–100)
GLUCOSE, POC: 142 mg/dL — ABNORMAL HIGH (ref 70–100)
GLUCOSE, POC: 151 mg/dL — ABNORMAL HIGH (ref 70–100)
GLUCOSE, POC: 153 mg/dL — ABNORMAL HIGH (ref 70–100)

## 2023-09-23 LAB — MAGNESIUM: MAGNESIUM: 1.8 mg/dL — ABNORMAL LOW (ref 1.9–2.7)

## 2023-09-23 MED ORDER — LOSARTAN 50 MG TABLET
50.0000 mg | ORAL_TABLET | Freq: Two times a day (BID) | ORAL | Status: DC
Start: 2023-09-23 — End: 2023-09-24
  Administered 2023-09-23 – 2023-09-24 (×2): 50 mg via ORAL
  Filled 2023-09-23 (×2): qty 1

## 2023-09-23 MED ORDER — FAMOTIDINE 20 MG TABLET
20.0000 mg | ORAL_TABLET | Freq: Every day | ORAL | Status: DC
Start: 2023-09-23 — End: 2023-09-24
  Administered 2023-09-23 – 2023-09-24 (×2): 20 mg via ORAL
  Filled 2023-09-23 (×2): qty 1

## 2023-09-23 NOTE — Progress Notes (Signed)
 [facility]               IP PROGRESS NOTE      Karen, Kama Chase  Date of Admission:  09/18/2023  Date of Birth:  June 17, 1940  Date of Service:  09/23/2023    Hospital Day:  LOS: 5 days     Subjective:   Karen Chase is a 83 y.o. female with a known past medical history of Chronic Obstructive Pulmonary Disease, chronic kidney disease, hypertension, diabetes mellitus, and CAD presented to the ED with right hip pain after mechanical fall over door threshold. Patient was found to have right femoral neck fracture.  Orthopedics was consulted from the emergency department, agreeable to follow. Patient does normally follow with Dr.Qazi for cardiac problems.  No history of echo in our EMR.      09/19/23 - pt seen and examined at bedside. Home meds reviewed. Notes pain to the right leg and hip. Surgery scheduled for today. Not other complaints at this time.    09/20/23 - pt seen and examined at bedside. States pain is better since surgery yesterday, controlled at this time. OT at bedside. PT to evaluate.     09/21/23 - pt seen and examined at bedside. No acute complaints at this time. She has been able to bear some weight with PT. Waiting on SNF placement.     09/22/23 - pt seen and examined at bedside. BP has been elevated. Will add clonidine  prn. No acute complaints at this time.    09/23/23 - pt seen and examined at bedside. BP continues to remain elevated. Adjusting antihypertensives accordingly. No acute complaints at this time.    Vital Signs:  Temp (24hrs) Max:37.5 C (99.5 F)      Temperature: 36.9 C (98.5 F)  BP (Non-Invasive): (!) 165/80  MAP (Non-Invasive): 103 mmHG  Heart Rate: 74  Respiratory Rate: 16  SpO2: 91 %    Current Medications:  acetaminophen  (TYLENOL ) tablet, 650 mg, Oral, Q4H PRN  alcohol  62 % (NOZIN NASAL SANITIZER) nasal swab packet, 1 Each, Each Nostril, 2x/day  aspirin  chewable tablet 81 mg, 81 mg, Oral, 2x/day  bisacodyl  (DULCOLAX) rectal suppository, 10 mg, Rectal, Daily  PRN  cholecalciferol  (VITAMIN D3) 1000 unit (25 mcg) tablet, 1,000 Units, Oral, Daily  cloNIDine  (CATAPRES ) tablet, 0.2 mg, Oral, Q8H PRN  Correction/SSIP insulin  lispro 100 units/mL injection, 2-9 Units, Subcutaneous, 4x/day AC  dextrose  (GLUTOSE) 40% oral gel, 15 g, Oral, Q15 Min PRN  dextrose  50% (0.5 g/mL) injection - syringe, 12.5 g, Intravenous, Q15 Min PRN  dilTIAZem  (CARDIZEM  CD) 24 hr extended release capsule, 240 mg, Oral, Daily  diphenhydrAMINE  (BENADRYL ) 50 mg/mL injection, 12.5 mg, Intravenous, Q6H PRN  docusate sodium  (COLACE) capsule, 100 mg, Oral, 2x/day  donepezil  (ARICEPT ) tablet, 10 mg, Oral, Daily  ferrous sulfate  324 mg (65 mg elemental IRON ) tablet, 324 mg, Oral, Daily before Breakfast  glucagon  injection 1 mg, 1 mg, IntraMUSCULAR, Once PRN  guaiFENesin  (MUCINEX ) extended release tablet - for cough (expectorant), 600 mg, Oral, 2x/day  heparin  5,000 unit/mL injection, 5,000 Units, Subcutaneous, Q8HRS  HYDROmorphone  (DILAUDID ) 2 mg/mL injection, 0.2 mg, Intravenous, Q4H PRN  HYDROmorphone  (DILAUDID ) 2 mg/mL injection, 0.2 mg, Intravenous, Q4H PRN  ipratropium-albuterol  0.5 mg-3 mg(2.5 mg base)/3 mL Solution for Nebulization, 3 mL, Nebulization, Q4H PRN  LORazepam  (ATIVAN ) tablet, 1 mg, Oral, 3x/day  losartan  (COZAAR ) tablet, 50 mg, Oral, 2x/day  LR premix infusion, , Intravenous, Continuous  Magic Mouthwash, 10 mL, Swish & Spit, 3x/day PRN  metoprolol  succinate (TOPROL -XL) 24 hr extended release tablet, 25 mg, Oral, 2x/day  montelukast  (SINGULAIR ) 10 mg tablet, 10 mg, Oral, Daily  morphine  2 mg/mL injection, 2 mg, Intravenous, Q3H PRN  morphine  2 mg/mL injection, 2 mg, Intravenous, Q2H PRN  multivitamin (THERA) tablet, 1 Tablet, Oral, Daily  naloxone  (NARCAN ) 0.4 mg/mL injection, 0.4 mg, Intravenous, Q2 MIN PRN  ondansetron  (ZOFRAN ) 2 mg/mL injection, 4 mg, Intravenous, Q4H PRN  oxyCODONE -acetaminophen  (PERCOCET) 5-325mg  per tablet, 1 Tablet, Oral, Q4H PRN  oxyCODONE -acetaminophen  (PERCOCET)  5-325mg  per tablet, 2 Tablet, Oral, Q4H PRN  pantoprazole  (PROTONIX ) delayed release tablet, 40 mg, Oral, Daily  polyethylene glycol (MIRALAX ) oral packet, 17 g, Oral, Daily PRN  pravastatin  (PRAVACHOL ) tablet, 80 mg, Oral, QPM  prochlorperazine  (COMPAZINE ) 5 mg/mL injection, 10 mg, Intravenous, Q6H PRN  sertraline  (ZOLOFT ) tablet, 100 mg, Oral, Daily        Current Orders:  Active Orders   Lab    BASIC METABOLIC PANEL - AM ONCE     Frequency: 0530 - AM DRAW     Number of Occurrences: 1 Occurrences    BASIC METABOLIC PANEL - ONCE     Frequency: ONE TIME     Number of Occurrences: 1 Occurrences    CBC WITH DIFF     Frequency: Once     Number of Occurrences: 1 Occurrences    CBC/DIFF - AM ONCE     Frequency: 0530 - AM DRAW     Number of Occurrences: 1 Occurrences    CBC/DIFF - ONCE     Frequency: ONE TIME     Number of Occurrences: 1 Occurrences    MAGNESIUM      Frequency: ONE TIME     Number of Occurrences: 1 Occurrences    MAGNESIUM  - AM ONCE     Frequency: 0530 - AM DRAW     Number of Occurrences: 1 Occurrences   Diet    DIET REGULAR Do you want to initiate MNT Protocol? Yes     Frequency: All Meals     Number of Occurrences: 1 Occurrences   Nursing    ACTIVITY     Frequency: UNTIL DISCONTINUED     Number of Occurrences: Until Specified    BLADDER SCAN IF PATIENT HAS NOT VOIDED WITHIN 4 HOURS     Frequency: Q4H PRN     Number of Occurrences: Until Specified    CHANGE OUTER DRESSING ONLY     Frequency: PRN     Number of Occurrences: Until Specified    COLD THERAPY     Frequency: UNTIL DISCONTINUED     Number of Occurrences: Until Specified    ENCOURAGE FREQUENT ANKLE/FOOT MOTION Q1HR WHILE AWAKE     Frequency: Q1H     Number of Occurrences: Until Specified    HEELS OFF BED/UTILIZE HEEL RELIEF FUNCTION ON BED     Frequency: CONTINUOUS     Number of Occurrences: Until Specified    HYPOGLYCEMIA MANAGEMENT - CONSCIOUS PATIENT W/DIET ORDER     Frequency: UNTIL DISCONTINUED     Number of Occurrences: Until Specified     HYPOGLYCEMIA MANAGEMENT - UNCONSCIOUS/ALTERED/NPO PATIENT     Frequency: UNTIL DISCONTINUED     Number of Occurrences: Until Specified    HYPOGLYCEMIA TREATMENT ALGORITHM     Frequency: UNTIL DISCONTINUED     Number of Occurrences: Until Specified    INCENTIVE SPIROMETRY NURSING     Frequency: Q1H WA     Number of Occurrences: 250 Occurrences    INTAKE  AND OUTPUT QSHIFT     Frequency: QSHIFT     Number of Occurrences: Until Specified    INTAKE AND OUTPUT QSHIFT     Frequency: QSHIFT     Number of Occurrences: Until Specified    KEEP FOOT OF BED FLAT.  DO NOT PUT PILLOW UNDER KNEE     Frequency: UNTIL DISCONTINUED     Number of Occurrences: Until Specified    Notify MD Vital Signs     Frequency: PRN     Number of Occurrences: Until Specified    Notify MD Vital Signs     Frequency: PRN     Number of Occurrences: Until Specified     Order Comments: Notify MD if urine output is less than 500 mL 8 hours after surgery.      NURSING DIET INFORMATION (NOT A DIET ORDER) - ADVANCE DIET AS TOLERATED.  NURSING TO ENTER SECONDARY ORDER FOR ACUTAL DIET ORDER.     Frequency: UNTIL DISCONTINUED     Number of Occurrences: Until Specified     Order Comments: If instructions require a change in diet, nursing needs to enter appropriate secondary diet order.      NURSING IF PATIENT NOT SEEN BY PHYSICAL THERAPY ON DAY OF SURGERY: 2 HRS AFTER SURGERY, STAND PATIENT AND WALK PATIENT 80 FEET.     Frequency: UNTIL DISCONTINUED     Number of Occurrences: Until Specified    PT IS HIGH RISK FOR VENOUS THROMBOEMBOLISM     Frequency: CONTINUOUS     Number of Occurrences: Until Specified    PT IS INTERMEDIATE RISK FOR VENOUS THROMBOEMBOLISM     Frequency: CONTINUOUS     Number of Occurrences: Until Specified    PULSE OXIMETRY Q4H     Frequency: Q4H     Number of Occurrences: Until Specified    TELEMETRY MONITORING - Continuous     Frequency: CONTINUOUS     Number of Occurrences: Until Specified    TRAPEZE TO BED     Frequency: CONTINUOUS      Number of Occurrences: Until Specified    VITAL SIGNS  Q4H     Frequency: Q4H     Number of Occurrences: Until Specified    VITAL SIGNS WITH PULSE OXIMETRY Q4HRS x 24HRS, THEN Q8HRS     Frequency: UNTIL DISCONTINUED     Number of Occurrences: Until Specified    WEIGH PATIENT     Frequency: DAILY (0600)     Number of Occurrences: Until Specified    WEIGH PATIENT     Frequency: UPON ADMISSION     Number of Occurrences: 1 Occurrences   Code Status    FULL CODE: ATTEMPT RESUSCITATION / CPR     Frequency: CONTINUOUS     Number of Occurrences: Until Specified     Order Comments: Patient wishes for full ICU level care including advanced airway interventions / mechanical ventilation.     In the event of pulseless cardiac arrest, patient consents to ACLS (advanced cardiac life support) to attempt resuscitation.  IE - Consents to chest compressions, life support including intubation, mechanical ventilation, defibrillation/cardioversion as indicated.         Consult    IP CONSULT TO CARE MANAGEMENT     Frequency: ONE TIME     Number of Occurrences: 1 Occurrences    IP CONSULT TO ORTHOPEDICS Requested Provider; JOESPH KOCHER     Frequency: ONE TIME     Number of Occurrences: 1 Occurrences   OT  OT EVAL & TREAT     Frequency: Per Therapist Discretion     Number of Occurrences: 1 Occurrences     Scheduling Instructions:               PT    PT EVALUATE AND TREAT     Frequency: Per Therapist Discretion     Number of Occurrences: 1 Occurrences     Order Comments: Post Op Total Hip & Total Knee patients - Physical Therapy to start day of surgery if patient on nursing unit by 2pm.  Post Op Total Shoulder patients - Physical Therapy to start POD #1       Scheduling Instructions:               Respiratory Care    INCENTIVE SPIROMETRY - RT INSTRUCT     Frequency: ONE TIME     Number of Occurrences: 1 Occurrences    OXYGEN - NASAL CANNULA     Frequency: PRN     Number of Occurrences: Until Specified     Order Comments: Flowrate  should not exeed 6L/min except [facility] facility.           OXYGEN WEANING PARAMETERS     Frequency: UNTIL DISCONTINUED     Number of Occurrences: Until Specified   Point of Care Testing    PERFORM POC WHOLE BLOOD GLUCOSE     Frequency: TID AC & HS     Number of Occurrences: Until Specified   Precaution    HIP PRECAUTIONS - NO HYPEREXTENSION, NO EXTERNAL ROTATION     Frequency: UNTIL DISCONTINUED     Number of Occurrences: Until Specified   Medications    acetaminophen  (TYLENOL ) tablet     Frequency: Q4H PRN     Dose: 650 mg     Route: Oral    alcohol  62 % (NOZIN NASAL SANITIZER) nasal swab packet     Frequency: 2x/day     Dose: 1 Each     Route: Each Nostril    aspirin  chewable tablet 81 mg     Frequency: 2x/day     Dose: 81 mg     Route: Oral    bisacodyl  (DULCOLAX) rectal suppository     Frequency: Daily PRN     Dose: 10 mg     Route: Rectal    cholecalciferol  (VITAMIN D3) 1000 unit (25 mcg) tablet     Frequency: Daily     Dose: 1,000 Units     Route: Oral    cloNIDine  (CATAPRES ) tablet     Frequency: Q8H PRN     Dose: 0.2 mg     Route: Oral    Correction/SSIP insulin  lispro 100 units/mL injection     Frequency: 4x/day AC     Dose: 2-9 Units     Route: Subcutaneous    dextrose  (GLUTOSE) 40% oral gel     Frequency: Q15 Min PRN     Dose: 15 g     Route: Oral    dextrose  50% (0.5 g/mL) injection - syringe     Frequency: Q15 Min PRN     Dose: 12.5 g     Route: Intravenous    dilTIAZem  (CARDIZEM  CD) 24 hr extended release capsule     Frequency: Daily     Dose: 240 mg     Route: Oral    diphenhydrAMINE  (BENADRYL ) 50 mg/mL injection     Frequency: Q6H PRN     Dose: 12.5 mg  Route: Intravenous    docusate sodium  (COLACE) capsule     Frequency: 2x/day     Dose: 100 mg     Route: Oral    donepezil  (ARICEPT ) tablet     Frequency: Daily     Dose: 10 mg     Route: Oral    ferrous sulfate  324 mg (65 mg elemental IRON ) tablet     Frequency: Daily before Breakfast     Dose: 324 mg     Route: Oral    glucagon  injection 1 mg      Frequency: Once PRN     Dose: 1 mg     Route: IntraMUSCULAR    guaiFENesin  (MUCINEX ) extended release tablet - for cough (expectorant)     Frequency: 2x/day     Dose: 600 mg     Route: Oral    heparin  5,000 unit/mL injection     Frequency: Q8HRS     Dose: 5,000 Units     Route: Subcutaneous    HYDROmorphone  (DILAUDID ) 2 mg/mL injection     Frequency: Q4H PRN     Dose: 0.2 mg     Route: Intravenous    HYDROmorphone  (DILAUDID ) 2 mg/mL injection     Frequency: Q4H PRN     Dose: 0.2 mg     Route: Intravenous    ipratropium-albuterol  0.5 mg-3 mg(2.5 mg base)/3 mL Solution for Nebulization     Frequency: Q4H PRN     Dose: 3 mL     Route: Nebulization    LORazepam  (ATIVAN ) tablet     Frequency: 3x/day     Dose: 1 mg     Route: Oral    losartan  (COZAAR ) tablet     Frequency: 2x/day     Dose: 50 mg     Route: Oral    LR premix infusion     Frequency: Continuous     Route: Intravenous    Magic Mouthwash     Frequency: 3x/day PRN     Dose: 10 mL     Route: Swish & Spit    metoprolol  succinate (TOPROL -XL) 24 hr extended release tablet     Frequency: 2x/day     Dose: 25 mg     Route: Oral    montelukast  (SINGULAIR ) 10 mg tablet     Frequency: Daily     Dose: 10 mg     Route: Oral    morphine  2 mg/mL injection     Frequency: Q3H PRN     Dose: 2 mg     Route: Intravenous    morphine  2 mg/mL injection     Frequency: Q2H PRN     Dose: 2 mg     Route: Intravenous    multivitamin (THERA) tablet     Frequency: Daily     Dose: 1 Tablet     Route: Oral    naloxone  (NARCAN ) 0.4 mg/mL injection     Frequency: Q2 MIN PRN     Dose: 0.4 mg     Route: Intravenous    ondansetron  (ZOFRAN ) 2 mg/mL injection     Frequency: Q4H PRN     Dose: 4 mg     Route: Intravenous    oxyCODONE -acetaminophen  (PERCOCET) 5-325mg  per tablet     Frequency: Q4H PRN     Dose: 1 Tablet     Route: Oral    oxyCODONE -acetaminophen  (PERCOCET) 5-325mg  per tablet     Frequency: Q4H PRN     Dose: 2 Tablet  Route: Oral    pantoprazole  (PROTONIX ) delayed release tablet      Frequency: Daily     Dose: 40 mg     Route: Oral    polyethylene glycol (MIRALAX ) oral packet     Frequency: Daily PRN     Dose: 17 g     Route: Oral    pravastatin  (PRAVACHOL ) tablet     Frequency: QPM     Dose: 80 mg     Route: Oral    prochlorperazine  (COMPAZINE ) 5 mg/mL injection     Frequency: Q6H PRN     Dose: 10 mg     Route: Intravenous    sertraline  (ZOLOFT ) tablet     Frequency: Daily     Dose: 100 mg     Route: Oral        Review of Systems:  Focused review of system was completed. Refer to the HPI for ROS details.     Today's Physical Exam:  Physical Exam  Vitals reviewed.   Constitutional:       Appearance: Normal appearance.   HENT:      Head: Normocephalic and atraumatic.      Mouth/Throat:      Mouth: Mucous membranes are moist.   Eyes:      Extraocular Movements: Extraocular movements intact.   Cardiovascular:      Rate and Rhythm: Normal rate and regular rhythm.      Heart sounds: No murmur heard.  Pulmonary:      Effort: Pulmonary effort is normal.      Breath sounds: Normal breath sounds.      Comments: On RA  Abdominal:      General: Abdomen is flat.      Palpations: Abdomen is soft.      Tenderness: There is no abdominal tenderness.   Musculoskeletal:         General: Tenderness (Right hip) present.      Cervical back: Normal range of motion and neck supple.   Skin:     General: Skin is warm and dry.   Neurological:      Mental Status: She is alert and oriented to person, place, and time.   Psychiatric:         Mood and Affect: Mood normal.         Behavior: Behavior normal.          I/O:  I/O last 24 hours:    Intake/Output Summary (Last 24 hours) at 09/23/2023 1214  Last data filed at 09/23/2023 0901  Gross per 24 hour   Intake 720 ml   Output 700 ml   Net 20 ml     I/O current shift:  07/27 0700 - 07/27 1859  In: 240 [P.O.:240]  Out: -     Labs:  Reviewed: I have reviewed all lab results.  Lab Results Today:    Results for orders placed or performed during the hospital encounter of 09/18/23  (from the past 24 hours)   POC BLOOD GLUCOSE (RESULTS)   Result Value Ref Range    GLUCOSE, POC 142 (H) 70 - 100 mg/dl   POC BLOOD GLUCOSE (RESULTS)   Result Value Ref Range    GLUCOSE, POC 162 (H) 70 - 100 mg/dl   POC BLOOD GLUCOSE (RESULTS)   Result Value Ref Range    GLUCOSE, POC 160 (H) 70 - 100 mg/dl   POC BLOOD GLUCOSE (RESULTS)   Result Value Ref Range    GLUCOSE, POC  142 (H) 70 - 100 mg/dl     Micro Results: No results found for any visits on 09/18/23 (from the past 24 hours).  Images:   No results found.     XR HIP RIGHT 1 VIEW   Final Result   Expected postoperative appearance of recent right hip arthroplasty                Radiologist location ID: [id number]         XR HIP RIGHT W PELVIS 2-3 VIEWS   Final Result   Acute displaced right femoral neck fracture                      Radiologist location ID: [id number]         XR AP MOBILE CHEST   Final Result   NEGATIVE CHEST            Radiologist location ID: [id number]              Problem List:  Active Hospital Problems   (*Primary Problem)    Diagnosis    *Fracture of right hip    Depression    Chronic kidney disease, stage 3 unspecified         Chronic obstructive pulmonary disease, unspecified         Essential hypertension    Type 2 diabetes mellitus         Mixed hyperlipidemia       Nutrition:   DIET REGULAR Do you want to initiate MNT Protocol? Yes             Additional clinical characteristics related to nutrition:    - monitor for weight changes   - monitor intake and output    - monitor bowel functions        Lab Results   Component Value Date    ALBUMIN 4.5 09/19/2023     Assessment/ Plan:   Fracture of the right hip  -s/p right hip surgery  -pain control  2. Depression/Anxiety  -home medications  3. HTN  -home meds  -BP has been elevated, likely pain related   -increase losartan  to 50 mg BID  -add clonidine  prn   4. GERD   -protonix   5. Iron  deficiency  -home oral iron  replacement  6. Hyperlipidemia  -home pravastatin   7. Type 2 DM  -SSI      Hospitalist personally evaluated and examined the patient in conjunction with the MLP and agree with the assessments, treatment plan and disposition of the patient as recorded by the [facility].     DVT/PE Prophylaxis: Heparin     Discharge Planning: SNF, waiting on placement    Recardo Hedger, PA

## 2023-09-23 NOTE — Progress Notes (Signed)
 ORTHOPAEDIC SURGERY PROGRESS NOTE    Patient Name: Karen Chase   MRN: [id number]   Date of Birth: [birth year]  Date: 09/23/2023  Age: 83 y.o.    Gender: female  Attending Physician: Claudene Register, DO    Subjective:    HPI:  Karen Chase is a 83 y.o. 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  Patient is up eating breakfast denies pain.  Patient was able to participate with physical therapy yesterday.    Objective:    Last Filed Vitals:  Filed Vitals:    09/23/23 0434 09/23/23 0740 09/23/23 0759 09/23/23 0800   BP: (!) 167/70 (!) 165/80 (!) 165/80 (!) 165/80   Pulse: 83 74 74    Resp: 16 16     Temp: 36.3 C (97.4 F) 36.9 C (98.5 F)     SpO2: 90% 92%  91%        Musculoskeletal Exam:     Examination of the operative extremity demontrates:    The patient is alert and oriented x3 no distress   Right hip incisions without redness drainage signs of infection no pain to palpation calf Homans sign negative    Radiographs:        Lab Data   Lab Results   Component Value Date    WBC 10.2 09/20/2023    HGB 11.9 09/20/2023    HCT 33.6 09/20/2023     Lab Results   Component Value Date    APTT 29.2 09/18/2023    INR 1.04 09/18/2023       Microbiology:      INFLUENZA VIRUS TYPE A   Date Value Ref Range Status   04/14/2019 Not Detected Not Detected Final     INFLUENZA VIRUS TYPE B   Date Value Ref Range Status   04/14/2019 Not Detected Not Detected Final           Impression and Plan:    Impression:   Postop right bipolar hip arthroplasty     Plan:  The patient is stable from ortho standpoint.  Can be discharged to rehab when medically stable      Cordella Hopkins, PA-C  09/23/23 09:33  [facility]  Please call with any questions/concerns     This note has been created with voice recognition software.  Please excuse any errors in transcription.  Occasional wrong word or sound alike substitutions may have occurred due to the inherent limitations of voice recognition software.   Please read the chart carefully and recognize using context with the substitutions may have occurred.

## 2023-09-23 NOTE — Care Plan (Signed)
 Problem: Adult Inpatient Plan of Care  Goal: Plan of Care Review  Outcome: Ongoing (see interventions/notes)  Goal: Patient-Specific Goal (Individualized)  Outcome: Ongoing (see interventions/notes)  Goal: Absence of Hospital-Acquired Illness or Injury  Outcome: Ongoing (see interventions/notes)  Intervention: Identify and Manage Fall Risk  Recent Flowsheet Documentation  Taken 09/23/2023 0000 by Rexene DEL, RN  Safety Promotion/Fall Prevention:   safety round/check completed   nonskid shoes/slippers when out of bed   fall prevention program maintained  Taken 09/22/2023 2200 by Rexene DEL, RN  Safety Promotion/Fall Prevention:   safety round/check completed   nonskid shoes/slippers when out of bed   fall prevention program maintained  Taken 09/22/2023 1939 by Rexene DEL, RN  Safety Promotion/Fall Prevention:   safety round/check completed   nonskid shoes/slippers when out of bed   fall prevention program maintained  Intervention: Prevent Skin Injury  Recent Flowsheet Documentation  Taken 09/22/2023 2200 by Rexene DEL, RN  Body Position: other (see comments)  Intervention: Prevent and Manage VTE (Venous Thromboembolism) Risk  Recent Flowsheet Documentation  Taken 09/22/2023 1939 by Rexene DEL, RN  VTE Prevention/Management: ambulation promoted  Goal: Optimal Comfort and Wellbeing  Outcome: Ongoing (see interventions/notes)  Goal: Rounds/Family Conference  Outcome: Ongoing (see interventions/notes)     Problem: Health Knowledge, Opportunity to Enhance (Adult,Obstetrics,Pediatric)  Goal: Knowledgeable about Health Subject/Topic  Description: Patient will demonstrate the desired outcomes by discharge/transition of care.  Outcome: Ongoing (see interventions/notes)     Problem: Skin Injury Risk Increased  Goal: Skin Health and Integrity  Outcome: Ongoing (see interventions/notes)     Problem: Fall Injury Risk  Goal: Absence of Fall and Fall-Related Injury  Outcome: Ongoing (see interventions/notes)  Intervention: Promote Injury-Free  Environment  Recent Flowsheet Documentation  Taken 09/23/2023 0000 by Rexene DEL, RN  Safety Promotion/Fall Prevention:   safety round/check completed   nonskid shoes/slippers when out of bed   fall prevention program maintained  Taken 09/22/2023 2200 by Rexene DEL, RN  Safety Promotion/Fall Prevention:   safety round/check completed   nonskid shoes/slippers when out of bed   fall prevention program maintained  Taken 09/22/2023 1939 by Rexene DEL, RN  Safety Promotion/Fall Prevention:   safety round/check completed   nonskid shoes/slippers when out of bed   fall prevention program maintained     Problem: Orthopaedic Fracture  Goal: Absence of Bleeding  Outcome: Ongoing (see interventions/notes)  Goal: Bowel Elimination  Outcome: Ongoing (see interventions/notes)  Goal: Absence of Embolism Signs and Symptoms  Outcome: Ongoing (see interventions/notes)  Intervention: Prevent or Manage Embolism Risk  Recent Flowsheet Documentation  Taken 09/22/2023 1939 by Rexene DEL, RN  VTE Prevention/Management: ambulation promoted  Goal: Fracture Stability  Outcome: Ongoing (see interventions/notes)  Goal: Optimal Functional Ability  Outcome: Ongoing (see interventions/notes)  Goal: Absence of Infection Signs and Symptoms  Outcome: Ongoing (see interventions/notes)  Goal: Effective Tissue Perfusion  Outcome: Ongoing (see interventions/notes)  Goal: Optimal Pain Control and Function  Outcome: Ongoing (see interventions/notes)  Goal: Effective Oxygenation and Ventilation  Outcome: Ongoing (see interventions/notes)     Problem: Wound  Goal: Optimal Coping  Outcome: Ongoing (see interventions/notes)  Goal: Optimal Functional Ability  Outcome: Ongoing (see interventions/notes)  Goal: Absence of Infection Signs and Symptoms  Outcome: Ongoing (see interventions/notes)  Goal: Improved Oral Intake  Outcome: Ongoing (see interventions/notes)  Goal: Optimal Pain Control and Function  Outcome: Ongoing (see interventions/notes)  Goal: Skin Health and  Integrity  Outcome: Ongoing (see interventions/notes)  Goal: Optimal Wound Healing  Outcome: Ongoing (see  interventions/notes)

## 2023-09-24 ENCOUNTER — Inpatient Hospital Stay (HOSPITAL_COMMUNITY)

## 2023-09-24 DIAGNOSIS — D509 Iron deficiency anemia, unspecified: Secondary | ICD-10-CM

## 2023-09-24 DIAGNOSIS — K5900 Constipation, unspecified: Secondary | ICD-10-CM

## 2023-09-24 LAB — BASIC METABOLIC PANEL
ANION GAP: 8 mmol/L (ref 4–13)
BUN/CREA RATIO: 20 (ref 6–22)
BUN: 17 mg/dL (ref 7–25)
CALCIUM: 9.3 mg/dL (ref 8.6–10.3)
CHLORIDE: 103 mmol/L (ref 98–107)
CO2 TOTAL: 26 mmol/L (ref 21–31)
CREATININE: 0.86 mg/dL (ref 0.60–1.30)
ESTIMATED GFR: 67 mL/min/1.73mˆ2 (ref >59)
GLUCOSE: 147 mg/dL — ABNORMAL HIGH (ref 74–109)
OSMOLALITY, CALCULATED: 278 mosm/kg (ref 270–290)
POTASSIUM: 4.2 mmol/L (ref 3.5–5.1)
SODIUM: 137 mmol/L (ref 136–145)

## 2023-09-24 LAB — POC BLOOD GLUCOSE (RESULTS)
GLUCOSE, POC: 138 mg/dL — ABNORMAL HIGH (ref 70–100)
GLUCOSE, POC: 159 mg/dL — ABNORMAL HIGH (ref 70–100)

## 2023-09-24 LAB — CBC WITH DIFF
BASOPHIL #: 0 x10ˆ3/uL (ref 0.00–0.10)
BASOPHIL %: 1 % (ref 0–1)
EOSINOPHIL #: 0.2 x10ˆ3/uL (ref 0.00–0.50)
EOSINOPHIL %: 4 % (ref 1–7)
HCT: 30.6 % — ABNORMAL LOW (ref 31.2–41.9)
HGB: 10.9 g/dL (ref 10.9–14.3)
LYMPHOCYTE #: 1.5 x10ˆ3/uL (ref 1.10–3.10)
LYMPHOCYTE %: 26 % (ref 16–46)
MCH: 31.3 pg (ref 24.7–32.8)
MCHC: 35.8 g/dL — ABNORMAL HIGH (ref 32.3–35.6)
MCV: 87.4 fL (ref 75.5–95.3)
MONOCYTE #: 0.5 x10ˆ3/uL (ref 0.20–0.90)
MONOCYTE %: 9 % (ref 4–11)
MPV: 6.7 fL — ABNORMAL LOW (ref 7.9–10.8)
NEUTROPHIL #: 3.4 x10ˆ3/uL (ref 1.90–8.20)
NEUTROPHIL %: 60 % (ref 43–77)
PLATELETS: 155 x10ˆ3/uL (ref 140–440)
RBC: 3.5 x10ˆ6/uL — ABNORMAL LOW (ref 3.63–4.92)
RDW: 12.7 % (ref 12.3–17.7)
WBC: 5.6 x10ˆ3/uL (ref 3.8–11.8)

## 2023-09-24 LAB — MAGNESIUM: MAGNESIUM: 1.8 mg/dL — ABNORMAL LOW (ref 1.9–2.7)

## 2023-09-24 MED ORDER — MAGNESIUM OXIDE 400 MG (241.3 MG MAGNESIUM) TABLET
400.0000 mg | ORAL_TABLET | Freq: Two times a day (BID) | ORAL | Status: DC
Start: 2023-09-24 — End: 2023-09-24
  Administered 2023-09-24: 400 mg via ORAL
  Filled 2023-09-24: qty 1

## 2023-09-24 MED ORDER — MAGNESIUM OXIDE 400 MG (241.3 MG MAGNESIUM) TABLET
400.0000 mg | ORAL_TABLET | Freq: Two times a day (BID) | ORAL | 0 refills | Status: AC
Start: 2023-09-24 — End: 2023-10-24

## 2023-09-24 MED ORDER — ASPIRIN 81 MG CHEWABLE TABLET
81.0000 mg | CHEWABLE_TABLET | Freq: Two times a day (BID) | ORAL | Status: AC
Start: 2023-09-24 — End: ?

## 2023-09-24 MED ORDER — LOSARTAN 50 MG TABLET
50.0000 mg | ORAL_TABLET | Freq: Two times a day (BID) | ORAL | 0 refills | Status: AC
Start: 2023-09-24 — End: 2023-10-24

## 2023-09-24 MED ORDER — METOPROLOL SUCCINATE ER 25 MG TABLET,EXTENDED RELEASE 24 HR
50.0000 mg | ORAL_TABLET | Freq: Every day | ORAL | 0 refills | Status: AC
Start: 2023-09-24 — End: 2023-10-24

## 2023-09-24 MED ORDER — CLONIDINE HCL 0.2 MG TABLET
0.2000 mg | ORAL_TABLET | Freq: Three times a day (TID) | ORAL | Status: AC | PRN
Start: 2023-09-24 — End: ?

## 2023-09-24 MED ORDER — GUAIFENESIN ER 600 MG TABLET, EXTENDED RELEASE 12 HR
600.0000 mg | EXTENDED_RELEASE_TABLET | Freq: Two times a day (BID) | ORAL | Status: AC
Start: 2023-09-24 — End: 2023-09-28

## 2023-09-24 MED ORDER — SENNOSIDES 8.6 MG-DOCUSATE SODIUM 50 MG TABLET
1.0000 | ORAL_TABLET | Freq: Two times a day (BID) | ORAL | Status: AC
Start: 2023-09-24 — End: ?

## 2023-09-24 MED ORDER — SENNOSIDES 8.6 MG-DOCUSATE SODIUM 50 MG TABLET
1.0000 | ORAL_TABLET | Freq: Two times a day (BID) | ORAL | Status: DC
Start: 2023-09-24 — End: 2023-09-24
  Administered 2023-09-24: 1 via ORAL

## 2023-09-24 MED ORDER — BISACODYL 10 MG RECTAL SUPPOSITORY
10.0000 mg | Freq: Every day | RECTAL | Status: AC | PRN
Start: 2023-09-24 — End: ?

## 2023-09-24 MED ORDER — POLYETHYLENE GLYCOL 3350 17 GRAM ORAL POWDER PACKET
17.0000 g | Freq: Every day | ORAL | Status: AC | PRN
Start: 2023-09-24 — End: 2023-10-24

## 2023-09-24 MED ORDER — FAMOTIDINE 20 MG TABLET
20.0000 mg | ORAL_TABLET | Freq: Every day | ORAL | 0 refills | Status: AC
Start: 2023-09-25 — End: 2023-10-25

## 2023-09-24 MED ORDER — MAGIC MOUTHWASH
10.0000 mL | Freq: Three times a day (TID) | ORAL | Status: AC | PRN
Start: 2023-09-24 — End: ?

## 2023-09-24 MED ORDER — CHOLECALCIFEROL (VITAMIN D3) 25 MCG (1,000 UNIT) TABLET
1000.0000 [IU] | ORAL_TABLET | Freq: Every day | ORAL | 0 refills | Status: AC
Start: 2023-09-25 — End: 2023-10-25

## 2023-09-24 MED ORDER — LACTULOSE 10 GRAM/15 ML ORAL SOLUTION
45.0000 mL | Freq: Once | ORAL | Status: AC
Start: 2023-09-24 — End: 2023-09-24
  Administered 2023-09-24: 45 mL via ORAL
  Filled 2023-09-24: qty 45

## 2023-09-24 NOTE — Care Management Notes (Signed)
 [facility]  Care Management Note    Patient Name: Karen Chase  Date of Birth: March 05, 1940  Sex: female  Date/Time of Admission: 09/18/2023  9:27 PM  Room/Bed: 374/A  Payor: HU[employer] MEDICARE / Plan: HU[employer] MEDICARE HMO / Product Type: MEDICARE MC /    LOS: 6 days   Primary Care Providers:  Christin Bailey LABOR, MD, MD (General)    Admitting Diagnosis:  Fracture of right hip [S72.001A]    Assessment:   Per Odette Mages, pt's insurance has approved [facility] to [facility] to [facility]. Pt will go to Room L6A on [address]. CM faxed discharge summary to [facility].    Discharge Plan:  SNF Placement (Medicare certified) (code 3)      The patient will continue to be evaluated for developing discharge needs.     Case Manager: Knox Ahle, [place]  Phone: [phone]

## 2023-09-24 NOTE — OT Treatment (Signed)
 [facility] Medicine [facility]  [address]  [place], [address]  [phone]  (Fax) [phone]  Rehabilitation Department     Occupational Therapy Daily Inpatient Note    Date: 09/24/2023  Patient's Name: Karen Chase  Date of Birth: August 09, 1940  Height: Height: 157.5 cm (5' 2)  Weight: Weight: 72.3 kg (159 lb 7 oz)        Plan: Will continue under current POC.         Subjective/Objective/Assessment:      09/24/23 1400   Rehab Session   Document Type therapy progress note (daily note)   OT Visit Date 09/24/23   Total OT Minutes: 9   Patient Effort adequate   General Information   Patient Profile Reviewed yes   Respiratory Status room air   Existing Precautions/Restrictions fall precautions;full code  (HIP PRECAUTIONS - NO HYPEREXTENSION, NO EXTERNAL ROTATION)   Pre Treatment Status   Pre Treatment Patient Status Patient supine in bed;Call light within reach;Patient safety alarm activated;Nurse approved session   Support Present Pre Treatment  Family present   Communication Pre Treatment  Nurse   Communication Pre Treatment Comment clear for ot   Vital Signs   Vitals Comment not connected   Cognition   Behavior/Mood Observations confused;distractible   Post Treatment Status   Post Treatment Patient Status Patient supine in bed;Call light within reach;Patient safety alarm activated   Support Present Post Treatment  Family present   Clinical Impression   Functional Level at Time of Session pt in bed and reports just finishing lunch. pt concerned with placement upon leaving hospital.  pt and family report recent Rx that has pt confused and lethargic. pt participate in review of adl kit for hip precaution compliance and expressed understanding at this time.pt reports desire/intent to sleep and is left with family and needs in reach.                 Intervention minutes: ADL/IADL TRAINING 9 minutes      THERAPIST  Lauraine Clause  09/24/2023, 14:43

## 2023-09-24 NOTE — Discharge Summary (Signed)
 [place] MEDICINE [facility]     DISCHARGE SUMMARY      PATIENT NAME:  Karen Chase   MRN:  [id number]  DOB:  [birth year]    INPATIENT ADMISSION DATE: 09/18/2023   DATE OF DISCHARGE:  09/24/23     ATTENDING PHYSICIAN: Eleanor JONETTA Borrow, PA-C     PRIMARY CARE PHYSICIAN: [name], MD     HOSPITAL PRESENTATION:    Please see full admission H&P for details.      As per HPI: 7/2    This 83 year old female with a known past medical history of Chronic Obstructive Pulmonary Disease, chronic kidney disease, hypertension, diabetes mellitus, and CAD presents emergency department via EMS status post trip and fall at home.  Patient was found to have right femoral neck fracture.  Orthopedics was consulted from the emergency department agreeable to follow with the patient from tomorrow morning.  Hospitalist to admit.  Patient's home medications will need to be reconciled once they are significantly gone over in the computer.  Patient does normally follow with Dr.Qazi for cardiac issues.  No history of echo in our EMR.  Patient shows no significant leg swelling.  Patient's lung sounds are equal and mildly diminished.  No advantageous sounds appreciated.  We will put on hospitalist sliding scale     FURTHER HOSPITAL COURSE WITH DISCHARGE DIAGNOSES:    Patient admitted with orthopedic consultation. Patient taken to OR on 7/23 for ORIF right hip with bipolar implants by Dr Joesph.  Patient worked to OT/PT who recommend SNF.   Had fluctuations in BP.  Meds adjsuted accordingly.   Patient is constipated and bowel regimen has been started.  Patient can be discharged to [facility] today for rehab. The Hospitalist personally evaluated and examined the patient in conjunction with the MLP and agree with the assessments, treatment plan and disposition of the patient as recorded by the [facility].            Problem List:  Active Hospital Problems   (*Primary Problem)    Diagnosis    *Hip fracture    Depression    Chronic kidney  disease, stage 3 unspecified     Noted by PACK TRISTEN A PA  last documented on [id number]      Chronic obstructive pulmonary disease, unspecified     Noted by BENCE BAILEY A MD  last documented on [id number]      Essential hypertension    Type 2 diabetes mellitus     Noted by LE HIEP V DO  last documented on [id number]      Mixed hyperlipidemia        Fx right hip s/p ORIF        Iron  deficiency anemia        GERD    Nutrition:    DIET REGULAR Do you want to initiate MNT Protocol? Yes    Additional clinical characteristics related to nutrition:    - monitor for weight changes   - monitor intake and output    - monitor bowel functions                    PHYSICAL EXAM   DAY OF DISCHARGE:    BP 120/60   Pulse 65   Temp 36.8 C (98.2 F)   Resp 17   Ht 1.575 m (5' 2)   Wt 72.3 kg (159 lb 7 oz)   SpO2 96%   BMI 29.16 kg/m  General:  Patient is resting in bed, no acute distress, alert and oriented   Eyes:  PERRL, no scleral icterus   HENT:  Normocephalic, atraumatic, oral mucosa is moist and pink, no nasal discharge   Heart:  RRR  Lungs:  Unlabored respirations.  Lungs are clear to auscultation bilaterally, no wheezes, no rales  Abdomen:  Soft, active bowel sounds, non-tender to palpation, non-distended  Extremities: . Tenderness right hip   Skin:  Warm and dry.  Not diaphoretic  Neuro:  A&O .  No focal deficits.   Not tremulous  Psych:  Cooperative, not agitated    LABS:  CBC with Diff (Last 48 Hours):    Recent Results in last 48 hours     09/23/23  1232 09/24/23  0509   WBC 6.4 5.6   HGB 11.0 10.9   HCT 30.9* 30.6*   MCV 88.7 87.4   PLTCNT 162 155   PMNS 72 60   LYMPHOCYTES 16 26   MONOCYTES 9 9   EOSINOPHIL 3 4   BASOPHILS 0  0.00 1  0.00          Last BMP  (Last result in the past 48 hours)        Na   K   Cl   CO2   BUN   Cr   Calcium   Glucose   Glucose-Fasting        09/24/23 0509 137   4.2   103   26   17   0.86   9.3   147                  BMP (Last 48 Hours):    Recent Results in last 48 hours      09/23/23  1232 09/24/23  0509   SODIUM 136 137   POTASSIUM 4.1 4.2   CHLORIDE 101 103   CO2 26 26   BUN 14 17   CREATININE 0.91 0.86   CALCIUM 9.1 9.3   GLUCOSENF 159* 147*          DISCHARGE MEDICATIONS:     Current Discharge Medication List        START taking these medications.        Details   aspirin  81 mg Tablet, Chewable   81 mg, Oral, 2 TIMES DAILY  Refills: 0     bisacodyL  10 mg Suppository  Commonly known as: DULCOLAX   10 mg, Rectal, DAILY PRN  Refills: 0     cholecalciferol  (vitamin D3) 25 mcg (1,000 unit) Tablet  Start taking on: September 25, 2023   1,000 Units, Oral, Daily  Qty: 30 Tablet  Refills: 0     cloNIDine  HCL 0.2 mg Tablet  Commonly known as: CATAPRES    0.2 mg, Oral, EVERY 8 HOURS PRN  Refills: 0     famotidine  20 mg Tablet  Commonly known as: PEPCID   Start taking on: September 25, 2023   20 mg, Oral, Daily  Qty: 30 Tablet  Refills: 0     guaiFENesin  600 mg Tablet Extended Release 12hr  Commonly known as: MUCINEX    600 mg, Oral, 2 TIMES DAILY  Refills: 0     lidocaine , diphenhydramine , maalox Solution  Commonly known as: MAGIC MOUTHWASH   10 mL, Swish & Spit, 3 TIMES DAILY PRN, Contains lidocaine  viscous 2%, diphenhydramine  12.5 mg/5 ml, Maalox. Mix 1:1:1  Refills: 0     magnesium  oxide 400 mg Tablet  Commonly known as: MAG-OX  400 mg, Oral, 2 TIMES DAILY  Qty: 60 Tablet  Refills: 0     polyethylene glycol 17 gram Powder in Packet  Commonly known as: MIRALAX    17 g, Oral, DAILY PRN  Refills: 0     sennosides-docusate sodium  8.6-50 mg Tablet  Commonly known as: SENOKOT-S   1 Tablet, Oral, 2 TIMES DAILY  Refills: 0            CONTINUE these medications which have CHANGED during your visit.        Details   losartan  50 mg Tablet  Commonly known as: COZAAR   What changed:   medication strength  how much to take  when to take this   50 mg, Oral, 2 TIMES DAILY  Qty: 60 Tablet  Refills: 0     metoprolol  succinate 25 mg Tablet Sustained Release 24 hr  Commonly known as: TOPROL -XL  What changed: See the new  instructions.   50 mg, Oral, Daily  Qty: 60 Tablet  Refills: 0            CONTINUE these medications - NO CHANGES were made during your visit.        Details   alendronate 70 mg Tablet  Commonly known as: FOSAMAX   Refills: 0     B complex-vitamin C-folic acid  0.8 mg Tablet  Commonly known as: NEPHRO-VITE   1 Tablet, Oral, Daily  Refills: 0     dilTIAZem  240 mg Capsule, Sust. Release 24 hr  Commonly known as: CARDIZEM  CD   240 mg, Daily  Refills: 0     diphenhydrAMINE  25 mg Capsule  Commonly known as: BENADRYL    25 mg, EVERY 6 HOURS PRN  Refills: 0     donepeziL  10 mg Tablet  Commonly known as: ARICEPT    1 Tablet, Daily  Refills: 0     escitalopram  oxalate 10 mg Tablet  Commonly known as: LEXAPRO    Refills: 0     ferrous sulfate  324 mg (65 mg iron ) Tablet, Delayed Release (E.C.)  Commonly known as: FERATAB   324 mg, 2 TIMES DAILY  Refills: 0     fluticasone propionate 50 mcg/actuation Spray, Suspension  Commonly known as: FLONASE   Refills: 0     gabapentin 100 mg Capsule  Commonly known as: NEURONTIN   100 mg, Daily  Refills: 0     Gemtesa 75 mg Tablet  Generic drug: vibegron   Refills: 0     loratadine 10 mg Tablet  Commonly known as: CLARITIN   10 mg, Oral, Daily  Refills: 0     LORazepam  1 mg Tablet  Commonly known as: ATIVAN    1 mg, 3 TIMES DAILY  Refills: 0     pantoprazole  40 mg Tablet, Delayed Release (E.C.)  Commonly known as: PROTONIX    40 mg, Oral, Daily  Refills: 0     pravastatin  80 mg Tablet  Commonly known as: PRAVACHOL    Refills: 0     sertraline  100 mg Tablet  Commonly known as: ZOLOFT    100 mg, Daily  Refills: 0     traMADoL  50 mg Tablet  Commonly known as: ULTRAM    1 Tablet, Oral, EVERY 6 HOURS PRN  Refills: 0            STOP taking these medications.      cetirizine  10 mg Tablet  Commonly known as: zyrTEC      ergocalciferol (vitamin D2) 1,250 mcg (50,000 unit) Capsule  Commonly known as: DRISDOL  esomeprazole magnesium  40 mg Capsule, Delayed Release(E.C.)  Commonly known as: NEXIUM      losartan -hydrochlorothiazide  100-25 mg Tablet  Commonly known as: HYZAAR      metFORMIN  500 mg Tablet  Commonly known as: GLUCOPHAGE      metoprolol  tartrate 25 mg Tablet  Commonly known as: LOPRESSOR      montelukast  10 mg Tablet  Commonly known as: SINGULAIR      olmesartan 40 mg Tablet  Commonly known as: BENICAR     Tagamet HB 200 mg Tablet  Generic drug: Cimetidine     vitamin D3-vitamin K2 1,250-200 mcg Capsule                 DISCHARGE DISPOSITION:  [facility]     DISCHARGE INSTRUCTIONS:    Follow-up Information       Joesph Kocher, DO Follow up in 2 week(s).    Specialty: [facility] SURGERY  Contact information:  [address]  [place] [address]  [phone]               Christin Bailey LABOR, MD Follow up in 1 week(s).    Specialty: CARDIOVASCULAR DISEASE  Contact information:  [address] [facility]  [place] [address]  [phone]                              Refer to Home Health - EXTERNAL     PT EVALUATE AND TREAT- OUTPATIENT     Need to be addressed: EVALUATE AND TREAT    Reason: Post THR protocol      DME - WALKER Front Wheeled    Please note - If patient is 300 lbs or greater please order bariatric or heavy duty items.     Patient has mobility limits that significantly impairs ability to participate in one or more mobility related ADL's (MRADL's): Yes    Moblity Limitations: Pt at heightened risk of injury r/t attempts to fulfill MRADL's & can safely use walker which resolves issue    Walker Type: Front Wheeled    Freedom of Choice: I have informed patient of their freedom of choice with respect to DME providers      DME - BEDSIDE COMMODE    A standard commode is covered when the patient is physically incapable of utilizing regular toilet facilities.  An extra wide/heavy duty commode chair is covered for a patient who weighs 300 pounds or more.     I certify that a bedside commode is necessary due to Patient is confined to one level of home and there is no toilet on that level     Bedside Commode Type Standard Bedside Commode Extra wide for pt > BMI 40   Freedom of Choice: I have informed patient of their freedom of choice with respect to DME providers       Follow-up Information       Joesph Kocher, DO Follow up in 2 week(s).    Specialty: [facility] SURGERY  Contact information:  [address]  [place] [address]  [phone]               Christin Bailey LABOR, MD Follow up in 1 week(s).    Specialty: CARDIOVASCULAR DISEASE  Contact information:  [address] [facility]  [place] [address]  [phone]  Copies sent to Care Team         Relationship Specialty Notifications Start End    Christin Bailey LABOR, MD PCP - General EXTERNAL  03/18/19     Phone: [phone] Fax: [phone]         [address] [facility] [place] [address]            >30 minutes total were spent coordinating discharge day today      Anyelo Mccue D Ragan Reale, PA-C  [facility] MEDICINE HOSPITALIST

## 2023-09-24 NOTE — Care Plan (Signed)
 Problem: Adult Inpatient Plan of Care  Goal: Plan of Care Review  Outcome: Ongoing (see interventions/notes)  Goal: Patient-Specific Goal (Individualized)  Outcome: Ongoing (see interventions/notes)  Goal: Absence of Hospital-Acquired Illness or Injury  Outcome: Ongoing (see interventions/notes)  Intervention: Identify and Manage Fall Risk  Recent Flowsheet Documentation  Taken 09/24/2023 0000 by Rexene DEL, RN  Safety Promotion/Fall Prevention:   safety round/check completed   nonskid shoes/slippers when out of bed   fall prevention program maintained  Taken 09/23/2023 2200 by Rexene DEL, RN  Safety Promotion/Fall Prevention:   safety round/check completed   nonskid shoes/slippers when out of bed   fall prevention program maintained  Taken 09/23/2023 2000 by Rexene DEL, RN  Safety Promotion/Fall Prevention:   safety round/check completed   nonskid shoes/slippers when out of bed   fall prevention program maintained  Taken 09/23/2023 1944 by Rexene DEL, RN  Safety Promotion/Fall Prevention:   safety round/check completed   nonskid shoes/slippers when out of bed   fall prevention program maintained  Intervention: Prevent Skin Injury  Recent Flowsheet Documentation  Taken 09/23/2023 2200 by Rexene DEL, RN  Body Position: other (see comments)  Taken 09/23/2023 2000 by Rexene DEL, RN  Body Position: other (see comments)  Taken 09/23/2023 1944 by Rexene DEL, RN  Skin Protection: adhesive use limited  Goal: Optimal Comfort and Wellbeing  Outcome: Ongoing (see interventions/notes)  Goal: Rounds/Family Conference  Outcome: Ongoing (see interventions/notes)     Problem: Health Knowledge, Opportunity to Enhance (Adult,Obstetrics,Pediatric)  Goal: Knowledgeable about Health Subject/Topic  Description: Patient will demonstrate the desired outcomes by discharge/transition of care.  Outcome: Ongoing (see interventions/notes)     Problem: Skin Injury Risk Increased  Goal: Skin Health and Integrity  Outcome: Ongoing (see interventions/notes)  Intervention: Optimize  Skin Protection  Recent Flowsheet Documentation  Taken 09/23/2023 1944 by Rexene DEL, RN  Pressure Reduction Techniques: Frequent weight shifting encouraged  Pressure Reduction Devices: Repositioning wedges/pillows utilized  Skin Protection: adhesive use limited     Problem: Fall Injury Risk  Goal: Absence of Fall and Fall-Related Injury  Outcome: Ongoing (see interventions/notes)  Intervention: Promote Scientist, clinical (histocompatibility and immunogenetics) Documentation  Taken 09/24/2023 0000 by Rexene DEL, RN  Safety Promotion/Fall Prevention:   safety round/check completed   nonskid shoes/slippers when out of bed   fall prevention program maintained  Taken 09/23/2023 2200 by Rexene DEL, RN  Safety Promotion/Fall Prevention:   safety round/check completed   nonskid shoes/slippers when out of bed   fall prevention program maintained  Taken 09/23/2023 2000 by Rexene DEL, RN  Safety Promotion/Fall Prevention:   safety round/check completed   nonskid shoes/slippers when out of bed   fall prevention program maintained  Taken 09/23/2023 1944 by Rexene DEL, RN  Safety Promotion/Fall Prevention:   safety round/check completed   nonskid shoes/slippers when out of bed   fall prevention program maintained     Problem: Orthopaedic Fracture  Goal: Absence of Bleeding  Outcome: Ongoing (see interventions/notes)  Goal: Bowel Elimination  Outcome: Ongoing (see interventions/notes)  Goal: Absence of Embolism Signs and Symptoms  Outcome: Ongoing (see interventions/notes)  Goal: Fracture Stability  Outcome: Ongoing (see interventions/notes)  Goal: Optimal Functional Ability  Outcome: Ongoing (see interventions/notes)  Goal: Absence of Infection Signs and Symptoms  Outcome: Ongoing (see interventions/notes)  Goal: Effective Tissue Perfusion  Outcome: Ongoing (see interventions/notes)  Goal: Optimal Pain Control and Function  Outcome: Ongoing (see interventions/notes)  Goal: Effective Oxygenation and Ventilation  Outcome: Ongoing (see interventions/notes)  Problem:  Wound  Goal: Optimal Coping  Outcome: Ongoing (see interventions/notes)  Goal: Optimal Functional Ability  Outcome: Ongoing (see interventions/notes)  Goal: Absence of Infection Signs and Symptoms  Outcome: Ongoing (see interventions/notes)  Goal: Improved Oral Intake  Outcome: Ongoing (see interventions/notes)  Goal: Optimal Pain Control and Function  Outcome: Ongoing (see interventions/notes)  Goal: Skin Health and Integrity  Outcome: Ongoing (see interventions/notes)  Intervention: Optimize Skin Protection  Recent Flowsheet Documentation  Taken 09/23/2023 1944 by Rexene DEL, RN  Pressure Reduction Techniques: Frequent weight shifting encouraged  Pressure Reduction Devices: Repositioning wedges/pillows utilized  Goal: Optimal Wound Healing  Outcome: Ongoing (see interventions/notes)  Intervention: Promote Wound Healing  Recent Flowsheet Documentation  Taken 09/23/2023 1944 by Rexene DEL, RN  Pressure Reduction Techniques: Frequent weight shifting encouraged  Pressure Reduction Devices: Repositioning wedges/pillows utilized

## 2023-09-24 NOTE — Nurses Notes (Signed)
 [employer] [phone]  Will be here within the hour.

## 2023-09-24 NOTE — Nurses Notes (Signed)
 Called report to [facility] in [place]  [phone]. Report given to [facility]. No unanswered questions at this time.   Patient is going to Room L6A 1st section.

## 2023-09-24 NOTE — Nurses Notes (Signed)
 DC order received. IV removed. Patient dressed in personal clothing. Items packed at bedside. Report given to [place] at [employer]. Patient is A&O.  Dressing is clean, dry, intact. Patient left floor with transport. Family was called, spoke to [facility] (Brother)

## 2023-09-24 NOTE — PT Treatment (Signed)
 [facility]  [address]  [place], [address]  [phone]  (Fax) [phone]  Rehabilitation Department  Physical Therapy Daily Inpatient Note    Date: 09/24/2023  Patient's Name: Karen Chase  Date of Birth: [birth year]  Height: Height: 157.5 cm (5' 2)  Weight: Weight: 72.3 kg (159 lb 7 oz)      Plan: Will continue under current POC.      Discharge Disposition: (P) skilled nursing facility        Subjective/Objective/Assessment:  Flowsheet    09/24/23 0829   Rehab Session   Document Type therapy progress note (daily note)   PT Visit Date 09/24/23   General Information   Patient Profile Reviewed yes   Medical Lines PIV Line;Telemetry   Respiratory Status room air   Existing Precautions/Restrictions posterior hip precautions   Pre Treatment Status   Pre Treatment Patient Status Patient sitting on edge of bed   Support Present Pre Treatment  Nurse present   Programmer, multimedia Nurse   Communication Pre Treatment Comment cleared   Pre-Treatment Pain   Pretreatment Pain Rating 0/10 - no pain   Transfer Assessment/Treatment   Sit-Stand Independence contact guard assist;minimum assist (75% patient effort)   Stand-Sit Independence contact guard assist;minimum assist (75% patient effort)   Sit-Stand-Sit, Assist Device walker, front wheeled   Transfer Impairments endurance;strength decreased   Gait Assessment/Treatment   Total Distance Ambulated 50   Independence  contact guard assist;minimum assist (75% patient effort)   Assistive Device  walker, front wheeled   Impairments  cognition impaired;endurance;pain;strength decreased   Comment couple short standing rests   Balance   Sitting Balance: Static good balance   Sitting, Dynamic (Balance) fair + balance   Sit-to-Stand Balance fair balance   Standing Balance: Static fair balance   Standing Balance: Dynamic fair - balance   Post Treatment Status   Post Treatment Patient Status Patient sitting in bedside chair  or w/c;Patient safety alarm activated   Communication Post Treatement Nurse   Patient Effort good   Post-Treatment Pain   Posttreatment Pain Rating 0/10 - no pain   Cognitive Assessment/Intervention   Behavior/Mood Observations behavior appropriate to situation, WNL/WFL   Physical Therapy Time and Intention   Total PT Minutes: 15   Therapy Plan Review/Discharge Plan (PT)   Anticipated Discharge Disposition skilled nursing facility     Patient sitting eob with nsg, reviewed hip precautions, standing with cga, gaited with rw 63ft with min cga, left up in the chair with needs in reach, cues needed to follow hip precautions today          Goals:     scoot/bridge, supine to sit/sit to supine  supervision required, set up required       stand-by assistance  walker, rolling  50  with improved gait pattern, hip and knee flex         sit-to-stand/stand-to-sit, bed-to-chair/chair-to-bed  stand-by assistance  walker, rolling                     Intervention minutes: GAIT TRAINING 15 MINUTES    THERAPIST  Orie Molt, PTA  09/24/2023, 11:32
